# Patient Record
Sex: Male | Born: 1970
Health system: Southern US, Community
[De-identification: ages and names within clinical notes are randomized; demographics above are authoritative.]

## PROBLEM LIST (undated history)

## (undated) DIAGNOSIS — M545 Low back pain, unspecified: Secondary | ICD-10-CM

## (undated) DIAGNOSIS — E782 Mixed hyperlipidemia: Secondary | ICD-10-CM

## (undated) DIAGNOSIS — Z9889 Other specified postprocedural states: Secondary | ICD-10-CM

## (undated) DIAGNOSIS — R5383 Other fatigue: Secondary | ICD-10-CM

## (undated) DIAGNOSIS — M1990 Unspecified osteoarthritis, unspecified site: Secondary | ICD-10-CM

## (undated) DIAGNOSIS — J3089 Other allergic rhinitis: Secondary | ICD-10-CM

## (undated) DIAGNOSIS — R222 Localized swelling, mass and lump, trunk: Secondary | ICD-10-CM

## (undated) DIAGNOSIS — E7800 Pure hypercholesterolemia, unspecified: Secondary | ICD-10-CM

## (undated) DIAGNOSIS — R7989 Other specified abnormal findings of blood chemistry: Secondary | ICD-10-CM

## (undated) DIAGNOSIS — E669 Obesity, unspecified: Secondary | ICD-10-CM

## (undated) DIAGNOSIS — J320 Chronic maxillary sinusitis: Secondary | ICD-10-CM

## (undated) DIAGNOSIS — R739 Hyperglycemia, unspecified: Secondary | ICD-10-CM

## (undated) DIAGNOSIS — N529 Male erectile dysfunction, unspecified: Secondary | ICD-10-CM

## (undated) HISTORY — DX: Hyperglycemia, unspecified: R73.9

## (undated) HISTORY — DX: Other specified abnormal findings of blood chemistry: R79.89

## (undated) HISTORY — DX: Mixed hyperlipidemia: E78.2

## (undated) HISTORY — DX: Low back pain, unspecified: M54.50

## (undated) HISTORY — DX: Unspecified osteoarthritis, unspecified site: M19.90

## (undated) HISTORY — DX: Male erectile dysfunction, unspecified: N52.9

## (undated) HISTORY — DX: Localized swelling, mass and lump, trunk: R22.2

## (undated) HISTORY — PX: KNEE ARTHROSCOPY: SUR90

## (undated) HISTORY — DX: Other fatigue: R53.83

## (undated) HISTORY — DX: Low back pain: M54.5

## (undated) HISTORY — DX: Other allergic rhinitis: J30.89

## (undated) HISTORY — DX: Pure hypercholesterolemia, unspecified: E78.00

## (undated) HISTORY — DX: Obesity, unspecified: E66.9

## (undated) HISTORY — DX: Other specified postprocedural states: Z98.890

---

## 1998-05-08 ENCOUNTER — Ambulatory Visit (HOSPITAL_COMMUNITY): Admission: RE | Admit: 1998-05-08 | Discharge: 1998-05-08 | Payer: Self-pay | Admitting: Obstetrics and Gynecology

## 1998-07-12 ENCOUNTER — Ambulatory Visit (HOSPITAL_COMMUNITY): Admission: RE | Admit: 1998-07-12 | Discharge: 1998-07-12 | Payer: Self-pay | Admitting: Obstetrics and Gynecology

## 2006-07-27 ENCOUNTER — Encounter: Admission: RE | Admit: 2006-07-27 | Discharge: 2006-07-27 | Payer: Self-pay | Admitting: Sports Medicine

## 2015-05-23 ENCOUNTER — Other Ambulatory Visit: Payer: Self-pay | Admitting: Otolaryngology

## 2015-05-23 ENCOUNTER — Ambulatory Visit
Admission: RE | Admit: 2015-05-23 | Discharge: 2015-05-23 | Disposition: A | Discharge status: Home / Self Care | Payer: Commercial Managed Care - PPO | Source: Ambulatory Visit | Attending: Otolaryngology | Admitting: Otolaryngology

## 2015-05-23 DIAGNOSIS — J320 Chronic maxillary sinusitis: Secondary | ICD-10-CM

## 2015-06-28 ENCOUNTER — Encounter (HOSPITAL_BASED_OUTPATIENT_CLINIC_OR_DEPARTMENT_OTHER): Payer: Self-pay | Admitting: *Deleted

## 2015-06-29 ENCOUNTER — Ambulatory Visit: Payer: Self-pay | Admitting: Otolaryngology

## 2015-06-30 ENCOUNTER — Ambulatory Visit: Payer: Self-pay | Admitting: Otolaryngology

## 2015-06-30 NOTE — H&P (Signed)
  Assessment  Chronic maxillary sinusitis (473.0) (J32.0). Orders  Amoxicillin-Pot Clavulanate 875-125 MG Oral Tablet;TAKE 1 TABLET TWICE DAILY AFTER MEALS UNTIL FINISHED; Qty28; R0; Rx. Discussed  Findings and history are consistent with chronic maxillary sinusitis. We'll treat with Augmentin for 2 weeks. If he has anything less than 100% resolution of symptoms we will have him followup for sinus imaging. Reason For Visit  Sinus pressure. HPI  Since about March or April, he has had persistent left anterior maxillary pressure, left side nasal congestion, and occasional discharge from the nose. He has no prior history of allergies or sinus problems. Antihistamines and nasal steroid spray did not help. Allergies  No Known Drug Allergies. Current Meds  Fluticasone Propionate 50 MCG/ACT Nasal Suspension;; RPT Cetirizine HCl TABS;; RPT. [place]  Knee Surgery. Family Hx  Family history of breast cancer: Mother (V16.3) (Z80.3) Family history of diabetes mellitus: Son (V18.0) (Z83.3) Family history of heart disease: Father (V74.49) (Z1.49) Family history of hypertension: Father (V17.49) (Z82.49) Family history of migraine headaches: Mother (V17.2) (Z82.0) Family history of stroke: Grandfather (V17.1) (Z82.3) Seasonal allergies: Mother (J30.2). Personal Hx  Former smoker (V15.82) [phone]) No caffeine use Social alcohol use (Z78.9). ROS  12 system ROS was obtained and reviewed on the Health Maintenance form dated today.  Positive responses are shown above.  If the symptom is not checked, the patient has denied it. Vital Signs   Recorded by Iran Sizer on 04 May 2015 10:31 AM BP:123/72,  BSA Calculated: 2.33 ,  BMI Calculated: 31.66 ,  Weight: 240 lb , BMI: 31.7 kg/m2,  Height: 6 ft 1 in. Physical Exam  APPEARANCE: Well developed, well nourished, in no acute distress.  Normal affect, in a pleasant mood.  Oriented to time, place and person. COMMUNICATION: Normal voice   HEAD &  FACE:  No scars, lesions or masses of head and face.  Sinuses nontender to palpation, except for the anterior aspect of the left.  Salivary glands without mass or tenderness.  Facial strength symmetric.  No facial lesion, scars, or mass. EYES: EOMI with normal primary gaze alignment. Visual acuity grossly intact.  PERRLA EXTERNAL EAR & NOSE: No scars, lesions or masses  EAC & TYMPANIC MEMBRANE:  EAC shows no obstructing lesions or debris and tympanic membranes are normal bilaterally with good movement to insufflation. GROSS HEARING: Normal   TMJ:  Nontender  INTRANASAL EXAM: No polyps or purulence. There is a slight leftward septal deviation and edema of the left middle turbinates without polyps or exudate. NASOPHARYNX: Normal, without lesions. LIPS, TEETH & GUMS: No lip lesions, normal dentition and normal gums. ORAL CAVITY/OROPHARYNX:  Oral mucosa moist without lesion or asymmetry of the palate, tongue, tonsil or posterior pharynx. NECK:  Supple without adenopathy or mass. THYROID:  Normal with no masses palpable.  NEUROLOGIC:  No gross CN deficits. No nystagmus noted.   LYMPHATIC:  No enlarged nodes palpable. Signature  Electronically signed by : Michel Bickers.D

## 2015-07-03 ENCOUNTER — Encounter (HOSPITAL_BASED_OUTPATIENT_CLINIC_OR_DEPARTMENT_OTHER): Payer: Self-pay

## 2015-07-03 ENCOUNTER — Ambulatory Visit (HOSPITAL_BASED_OUTPATIENT_CLINIC_OR_DEPARTMENT_OTHER): Payer: Commercial Managed Care - PPO | Admitting: Anesthesiology

## 2015-07-03 ENCOUNTER — Ambulatory Visit (HOSPITAL_BASED_OUTPATIENT_CLINIC_OR_DEPARTMENT_OTHER)
Admission: RE | Admit: 2015-07-03 | Discharge: 2015-07-03 | Disposition: A | Discharge status: Home / Self Care | Payer: Commercial Managed Care - PPO | Source: Ambulatory Visit | Attending: Otolaryngology | Admitting: Otolaryngology

## 2015-07-03 ENCOUNTER — Encounter (HOSPITAL_BASED_OUTPATIENT_CLINIC_OR_DEPARTMENT_OTHER)
Admission: RE | Disposition: A | Discharge status: Home / Self Care | Payer: Self-pay | Source: Ambulatory Visit | Attending: Otolaryngology

## 2015-07-03 DIAGNOSIS — Z87891 Personal history of nicotine dependence: Secondary | ICD-10-CM | POA: Insufficient documentation

## 2015-07-03 DIAGNOSIS — J320 Chronic maxillary sinusitis: Secondary | ICD-10-CM | POA: Insufficient documentation

## 2015-07-03 HISTORY — PX: NASAL SINUS SURGERY: SHX719

## 2015-07-03 HISTORY — DX: Chronic maxillary sinusitis: J32.0

## 2015-07-03 SURGERY — SINUS SURGERY, ENDOSCOPIC
Anesthesia: General | Site: Nose | Laterality: Left

## 2015-07-03 MED ORDER — FENTANYL CITRATE (PF) 100 MCG/2ML IJ SOLN
25.0000 ug | INTRAMUSCULAR | Status: DC | PRN
  Administered 2015-07-03 (×3): 50 ug via INTRAVENOUS

## 2015-07-03 MED ORDER — GLYCOPYRROLATE 0.2 MG/ML IJ SOLN: 0.2000 mg | Freq: Once | INTRAMUSCULAR | Status: DC | PRN

## 2015-07-03 MED ORDER — HYDROMORPHONE HCL 1 MG/ML IJ SOLN
0.5000 mg | Freq: Once | INTRAMUSCULAR | Status: AC
  Administered 2015-07-03: 0.5 mg via INTRAVENOUS

## 2015-07-03 MED ORDER — MIDAZOLAM HCL 2 MG/2ML IJ SOLN
INTRAMUSCULAR | Status: AC
  Filled 2015-07-03: qty 2

## 2015-07-03 MED ORDER — FENTANYL CITRATE (PF) 100 MCG/2ML IJ SOLN
INTRAMUSCULAR | Status: AC
  Filled 2015-07-03: qty 2

## 2015-07-03 MED ORDER — PROPOFOL 10 MG/ML IV BOLUS
INTRAVENOUS | Status: AC
  Filled 2015-07-03: qty 20

## 2015-07-03 MED ORDER — LACTATED RINGERS IV SOLN
INTRAVENOUS | Status: DC
  Administered 2015-07-03: 07:00:00 via INTRAVENOUS

## 2015-07-03 MED ORDER — PROPOFOL 10 MG/ML IV BOLUS
INTRAVENOUS | Status: DC | PRN
  Administered 2015-07-03: 200 mg via INTRAVENOUS

## 2015-07-03 MED ORDER — HYDROMORPHONE HCL 1 MG/ML IJ SOLN
INTRAMUSCULAR | Status: AC
  Filled 2015-07-03: qty 1

## 2015-07-03 MED ORDER — LIDOCAINE-EPINEPHRINE 1 %-1:100000 IJ SOLN
INTRAMUSCULAR | Status: AC
  Filled 2015-07-03: qty 1

## 2015-07-03 MED ORDER — OXYMETAZOLINE HCL 0.05 % NA SOLN
2.0000 | NASAL | Status: DC
  Administered 2015-07-03: 2 via NASAL

## 2015-07-03 MED ORDER — DEXAMETHASONE SODIUM PHOSPHATE 4 MG/ML IJ SOLN
INTRAMUSCULAR | Status: DC | PRN
  Administered 2015-07-03: 10 mg via INTRAVENOUS

## 2015-07-03 MED ORDER — CEFAZOLIN SODIUM-DEXTROSE 2-3 GM-% IV SOLR
2.0000 g | INTRAVENOUS | Status: AC
  Administered 2015-07-03: 2 g via INTRAVENOUS

## 2015-07-03 MED ORDER — CEPHALEXIN 500 MG PO CAPS: 500.0000 mg | ORAL_CAPSULE | Freq: Three times a day (TID) | ORAL | Status: DC

## 2015-07-03 MED ORDER — ONDANSETRON HCL 4 MG/2ML IJ SOLN
INTRAMUSCULAR | Status: DC | PRN
  Administered 2015-07-03: 4 mg via INTRAVENOUS

## 2015-07-03 MED ORDER — DEXAMETHASONE SODIUM PHOSPHATE 10 MG/ML IJ SOLN
INTRAMUSCULAR | Status: AC
  Filled 2015-07-03: qty 1

## 2015-07-03 MED ORDER — LIDOCAINE HCL (CARDIAC) 20 MG/ML IV SOLN
INTRAVENOUS | Status: AC
  Filled 2015-07-03: qty 5

## 2015-07-03 MED ORDER — OXYCODONE HCL 5 MG PO TABS
5.0000 mg | ORAL_TABLET | Freq: Once | ORAL | Status: AC | PRN
  Administered 2015-07-03: 5 mg via ORAL

## 2015-07-03 MED ORDER — PROMETHAZINE HCL 25 MG/ML IJ SOLN: 6.2500 mg | INTRAMUSCULAR | Status: DC | PRN

## 2015-07-03 MED ORDER — HYDROCODONE-ACETAMINOPHEN 7.5-325 MG PO TABS: 1.0000 | ORAL_TABLET | Freq: Four times a day (QID) | ORAL | Status: DC | PRN

## 2015-07-03 MED ORDER — OXYMETAZOLINE HCL 0.05 % NA SOLN
NASAL | Status: AC
  Filled 2015-07-03: qty 15

## 2015-07-03 MED ORDER — ONDANSETRON HCL 4 MG/2ML IJ SOLN
INTRAMUSCULAR | Status: AC
  Filled 2015-07-03: qty 2

## 2015-07-03 MED ORDER — LIDOCAINE-EPINEPHRINE 1 %-1:100000 IJ SOLN
INTRAMUSCULAR | Status: DC | PRN
  Administered 2015-07-03: 4 mL

## 2015-07-03 MED ORDER — SUCCINYLCHOLINE CHLORIDE 20 MG/ML IJ SOLN
INTRAMUSCULAR | Status: DC | PRN
  Administered 2015-07-03: 100 mg via INTRAVENOUS

## 2015-07-03 MED ORDER — CEFAZOLIN SODIUM-DEXTROSE 2-3 GM-% IV SOLR
INTRAVENOUS | Status: AC
  Filled 2015-07-03: qty 50

## 2015-07-03 MED ORDER — BACIT-POLY-NEO HC 1 % EX OINT
TOPICAL_OINTMENT | CUTANEOUS | Status: AC
  Filled 2015-07-03: qty 15

## 2015-07-03 MED ORDER — PROMETHAZINE HCL 25 MG RE SUPP: 25.0000 mg | Freq: Four times a day (QID) | RECTAL | Status: DC | PRN

## 2015-07-03 MED ORDER — SUCCINYLCHOLINE CHLORIDE 20 MG/ML IJ SOLN
INTRAMUSCULAR | Status: AC
  Filled 2015-07-03: qty 1

## 2015-07-03 MED ORDER — SCOPOLAMINE 1 MG/3DAYS TD PT72: 1.0000 | MEDICATED_PATCH | Freq: Once | TRANSDERMAL | Status: DC | PRN

## 2015-07-03 MED ORDER — OXYCODONE HCL 5 MG PO TABS
ORAL_TABLET | ORAL | Status: AC
  Filled 2015-07-03: qty 1

## 2015-07-03 MED ORDER — MIDAZOLAM HCL 2 MG/2ML IJ SOLN
1.0000 mg | INTRAMUSCULAR | Status: DC | PRN
  Administered 2015-07-03: 2 mg via INTRAVENOUS

## 2015-07-03 MED ORDER — FENTANYL CITRATE (PF) 100 MCG/2ML IJ SOLN
50.0000 ug | INTRAMUSCULAR | Status: DC | PRN
  Administered 2015-07-03 (×2): 100 ug via INTRAVENOUS

## 2015-07-03 MED ORDER — OXYCODONE HCL 5 MG/5ML PO SOLN: 5.0000 mg | Freq: Once | ORAL | Status: AC | PRN

## 2015-07-03 MED ORDER — LIDOCAINE HCL (CARDIAC) 20 MG/ML IV SOLN
INTRAVENOUS | Status: DC | PRN
  Administered 2015-07-03: 100 mg via INTRAVENOUS

## 2015-07-03 SURGICAL SUPPLY — 47 items
ATTRACTOMAT 16X20 MAGNETIC DRP (DRAPES) ×3 IMPLANT
BLADE RAD40 ROTATE 4M 4 5PK (BLADE) IMPLANT
BLADE RAD40 ROTATE 4M 4MM 5PK (BLADE)
BLADE RAD60 ROTATE M4 4 5PK (BLADE) IMPLANT
BLADE RAD60 ROTATE M4 4MM 5PK (BLADE)
BLADE TRICUT ROTATE M4 4 5PK (BLADE) ×1 IMPLANT
BLADE TRICUT ROTATE M4 4MM 5PK (BLADE) ×1
BUR HS RAD FRONTAL 3 (BURR) IMPLANT
BUR HS RAD FRONTAL 3MM (BURR)
CANISTER SUC SOCK COL 7IN (MISCELLANEOUS) IMPLANT
CANISTER SUCT 1200ML W/VALVE (MISCELLANEOUS) ×6 IMPLANT
CORDS BIPOLAR (ELECTRODE) IMPLANT
DECANTER SPIKE VIAL GLASS SM (MISCELLANEOUS) IMPLANT
DRESSING ADAPTIC 1/2  N-ADH (PACKING) IMPLANT
DRESSING NASAL KENNEDY 3.5X.9 (MISCELLANEOUS) IMPLANT
DRSG NASAL KENNEDY 3.5X.9 (MISCELLANEOUS)
DRSG NASAL KENNEDY LMNT 8CM (GAUZE/BANDAGES/DRESSINGS) IMPLANT
DRSG NASOPORE 8CM (GAUZE/BANDAGES/DRESSINGS) IMPLANT
DRSG TELFA 3X8 NADH (GAUZE/BANDAGES/DRESSINGS) IMPLANT
GAUZE SPONGE 4X4 16PLY XRAY LF (GAUZE/BANDAGES/DRESSINGS) IMPLANT
GAUZE VASELINE FOILPK 1/2 X 72 (GAUZE/BANDAGES/DRESSINGS) IMPLANT
GLOVE BIO SURGEON STRL SZ 6.5 (GLOVE) ×1 IMPLANT
GLOVE BIO SURGEONS STRL SZ 6.5 (GLOVE) ×1
GLOVE ECLIPSE 7.5 STRL STRAW (GLOVE) ×3 IMPLANT
GOWN STRL REUS W/ TWL LRG LVL3 (GOWN DISPOSABLE) ×2 IMPLANT
GOWN STRL REUS W/TWL LRG LVL3 (GOWN DISPOSABLE) ×6
HEMOSTAT SURGICEL .5X2 ABSORB (HEMOSTASIS) IMPLANT
IV NS 500ML (IV SOLUTION) ×3
IV NS 500ML BAXH (IV SOLUTION) IMPLANT
NDL PRECISIONGLIDE 27X1.5 (NEEDLE) ×1 IMPLANT
NDL SPNL 25GX3.5 QUINCKE BL (NEEDLE) IMPLANT
NEEDLE PRECISIONGLIDE 27X1.5 (NEEDLE) ×3 IMPLANT
NEEDLE SPNL 25GX3.5 QUINCKE BL (NEEDLE) IMPLANT
NS IRRIG 1000ML POUR BTL (IV SOLUTION) IMPLANT
PACK BASIN DAY SURGERY FS (CUSTOM PROCEDURE TRAY) ×3 IMPLANT
PACK ENT DAY SURGERY (CUSTOM PROCEDURE TRAY) ×3 IMPLANT
PAD DRESSING TELFA 3X8 NADH (GAUZE/BANDAGES/DRESSINGS) IMPLANT
PATTIES SURGICAL .5 X3 (DISPOSABLE) ×3 IMPLANT
SLEEVE SCD COMPRESS KNEE MED (MISCELLANEOUS) ×2 IMPLANT
SOLUTION BUTLER CLEAR DIP (MISCELLANEOUS) ×3 IMPLANT
SPONGE GAUZE 2X2 8PLY STER LF (GAUZE/BANDAGES/DRESSINGS) ×1
SPONGE GAUZE 2X2 8PLY STRL LF (GAUZE/BANDAGES/DRESSINGS) ×2 IMPLANT
SPONGE SURGIFOAM ABS GEL 12-7 (HEMOSTASIS) IMPLANT
TOWEL OR 17X24 6PK STRL BLUE (TOWEL DISPOSABLE) ×6 IMPLANT
TUBE CONNECTING 20'X1/4 (TUBING) ×1
TUBE CONNECTING 20X1/4 (TUBING) ×1 IMPLANT
YANKAUER SUCT BULB TIP NO VENT (SUCTIONS) ×3 IMPLANT

## 2015-07-03 NOTE — H&P (View-Only) (Signed)
  Assessment  Chronic maxillary sinusitis (473.0) (J32.0). Orders  Amoxicillin-Pot Clavulanate 875-125 MG Oral Tablet;TAKE 1 TABLET TWICE DAILY AFTER MEALS UNTIL FINISHED; Qty28; R0; Rx. Discussed  Findings and history are consistent with chronic maxillary sinusitis. We'll treat with Augmentin for 2 weeks. If he has anything less than 100% resolution of symptoms we will have him followup for sinus imaging. Reason For Visit  Sinus pressure. HPI  Since about March or April, he has had persistent left anterior maxillary pressure, left side nasal congestion, and occasional discharge from the nose. He has no prior history of allergies or sinus problems. Antihistamines and nasal steroid spray did not help. Allergies  No Known Drug Allergies. Current Meds  Fluticasone Propionate 50 MCG/ACT Nasal Suspension;; RPT Cetirizine HCl TABS;; RPT. [place]  Knee Surgery. Family Hx  Family history of breast cancer: Mother (V16.3) (Z80.3) Family history of diabetes mellitus: Son (V18.0) (Z83.3) Family history of heart disease: Father (V28.49) (Z47.49) Family history of hypertension: Father (V17.49) (Z82.49) Family history of migraine headaches: Mother (V17.2) (Z82.0) Family history of stroke: Grandfather (V17.1) (Z82.3) Seasonal allergies: Mother (J30.2). Personal Hx  Former smoker (V15.82) [phone]) No caffeine use Social alcohol use (Z78.9). ROS  12 system ROS was obtained and reviewed on the Health Maintenance form dated today.  Positive responses are shown above.  If the symptom is not checked, the patient has denied it. Vital Signs   Recorded by Iran Sizer on 04 May 2015 10:31 AM BP:123/72,  BSA Calculated: 2.33 ,  BMI Calculated: 31.66 ,  Weight: 240 lb , BMI: 31.7 kg/m2,  Height: 6 ft 1 in. Physical Exam  APPEARANCE: Well developed, well nourished, in no acute distress.  Normal affect, in a pleasant mood.  Oriented to time, place and person. COMMUNICATION: Normal voice   HEAD &  FACE:  No scars, lesions or masses of head and face.  Sinuses nontender to palpation, except for the anterior aspect of the left.  Salivary glands without mass or tenderness.  Facial strength symmetric.  No facial lesion, scars, or mass. EYES: EOMI with normal primary gaze alignment. Visual acuity grossly intact.  PERRLA EXTERNAL EAR & NOSE: No scars, lesions or masses  EAC & TYMPANIC MEMBRANE:  EAC shows no obstructing lesions or debris and tympanic membranes are normal bilaterally with good movement to insufflation. GROSS HEARING: Normal   TMJ:  Nontender  INTRANASAL EXAM: No polyps or purulence. There is a slight leftward septal deviation and edema of the left middle turbinates without polyps or exudate. NASOPHARYNX: Normal, without lesions. LIPS, TEETH & GUMS: No lip lesions, normal dentition and normal gums. ORAL CAVITY/OROPHARYNX:  Oral mucosa moist without lesion or asymmetry of the palate, tongue, tonsil or posterior pharynx. NECK:  Supple without adenopathy or mass. THYROID:  Normal with no masses palpable.  NEUROLOGIC:  No gross CN deficits. No nystagmus noted.   LYMPHATIC:  No enlarged nodes palpable. Signature  Electronically signed by : Michel Bickers.D

## 2015-07-03 NOTE — Anesthesia Preprocedure Evaluation (Addendum)
Anesthesia Evaluation  Patient identified by MRN, date of birth, ID band Patient awake    Reviewed: Allergy & Precautions, NPO status , Patient's Chart, lab work & pertinent test results  History of Anesthesia Complications Negative for: history of anesthetic complications  Airway Mallampati: II  TM Distance: >3 FB Neck ROM: Full    Dental  (+) Teeth Intact, Dental Advisory Given   Pulmonary neg pulmonary ROS,    Pulmonary exam normal breath sounds clear to auscultation       Cardiovascular Exercise Tolerance: Good (-) hypertension(-) angina(-) CAD and (-) Past MI negative cardio ROS Normal cardiovascular exam Rhythm:Regular Rate:Normal     Neuro/Psych negative neurological ROS  negative psych ROS   GI/Hepatic negative GI ROS, Neg liver ROS,   Endo/Other  Obesity   Renal/GU negative Renal ROS     Musculoskeletal negative musculoskeletal ROS (+)   Abdominal   Peds  Hematology negative hematology ROS (+)   Anesthesia Other Findings Day of surgery medications reviewed with the patient.  Reproductive/Obstetrics                            Anesthesia Physical Anesthesia Plan  ASA: II  Anesthesia Plan: General   Post-op Pain Management:    Induction: Intravenous  Airway Management Planned: Oral ETT  Additional Equipment:   Intra-op Plan:   Post-operative Plan: Extubation in OR  Informed Consent: I have reviewed the patients History and Physical, chart, labs and discussed the procedure including the risks, benefits and alternatives for the proposed anesthesia with the patient or authorized representative who has indicated his/her understanding and acceptance.   Dental advisory given  Plan Discussed with: CRNA  Anesthesia Plan Comments: (Risks/benefits of general anesthesia discussed with patient including risk of damage to teeth, lips, gum, and tongue, nausea/vomiting, allergic  reactions to medications, and the possibility of heart attack, stroke and death.  All patient questions answered.  Patient wishes to proceed.)        Anesthesia Quick Evaluation

## 2015-07-03 NOTE — Interval H&P Note (Signed)
History and Physical Interval Note:  07/03/2015 7:19 AM  Jamie Holland  has presented today for surgery, with the diagnosis of MAXILLARY SINUSITIS  The various methods of treatment have been discussed with the patient and family. After consideration of risks, benefits and other options for treatment, the patient has consented to  Procedure(s): [place] (Left) as a surgical intervention .  The patient's history has been reviewed, patient examined, no change in status, stable for surgery.  I have reviewed the patient's chart and labs.  Questions were answered to the patient's satisfaction.     Jamie Holland

## 2015-07-03 NOTE — Anesthesia Procedure Notes (Signed)
Procedure Name: Intubation Date/Time: 07/03/2015 7:33 AM Performed by: Lyndee Leo Pre-anesthesia Checklist: Patient identified, Emergency Drugs available, Suction available and Patient being monitored Patient Re-evaluated:Patient Re-evaluated prior to inductionOxygen Delivery Method: Circle System Utilized Preoxygenation: Pre-oxygenation with 100% oxygen Intubation Type: IV induction Ventilation: Mask ventilation without difficulty Laryngoscope Size: Mac and 4 Grade View: Grade II Tube type: Oral Rae Tube size: 8.0 mm Number of attempts: 1 Airway Equipment and Method: Stylet and Oral airway Placement Confirmation: ETT inserted through vocal cords under direct vision,  positive ETCO2 and breath sounds checked- equal and bilateral Secured at: 22 cm Tube secured with: Tape Dental Injury: Teeth and Oropharynx as per pre-operative assessment

## 2015-07-03 NOTE — Op Note (Signed)
OPERATIVE REPORT  DATE OF SURGERY: 07/03/2015  PATIENT:  Jamie Holland,  45 y.o. male  PRE-OPERATIVE DIAGNOSIS:  MAXILLARY SINUSITIS  POST-OPERATIVE DIAGNOSIS:  MAXILLARY SINUSITIS  PROCEDURE:  Procedure(s): LEFT MAXILLLARY SINUS ENDOSCOPIC SINUS SURGERY  SURGEON:  Beckie Salts, MD  ASSISTANTS: None  ANESTHESIA:   General   EBL:  20 ml  DRAINS: None  LOCAL MEDICATIONS USED:  1% Xylocaine with epinephrine  SPECIMEN:  Left maxillary sinus contents  COUNTS:  Correct  PROCEDURE DETAILS: The patient was taken to the operating room and placed on the operating table in the supine position. Following induction of general endotracheal anesthesia, the nose was prepped and draped in a standard fashion. Afrin spray was used preoperatively in the nasal cavities. 1% Xylocaine with epinephrine was infiltrated into the superior and posterior attachments of the left middle turbinate and the lateral nasal wall on the left. The 0 endoscope was used initially to inspect the infundibular area. There is obvious fullness of the infundibular region caused by an expansile mass within the maxillary sinus. A sickle knife was used to incise mucosa to expose the maxillary ostium. The medial wall of the maxillary sinus bone was thin and was medially placed almost up against the middle turbinate. This was removed and a large antrostomy was created. The microdebrider and endoscopic forceps were used to perform this. Inside the sinus was filled with a large mucocele appearing mass. This was opened and a large amount of thick pus was suctioned out. There was severe thickening of the soft tissue around the inferior medial aspect of the cyst. This was all cleaned out using curettes and angled forceps. The mucosa was stripped completely from the sinus. The microdebrider was used to trim off excess mucosa from the inferior aspect of the antrostomy. A backbiting forcep was used to trim the anterior aspect. All specimens  were sent for pathologic evaluation. Afrin pledgets were placed in the middle meatus. The pharynx was suctioned of blood and secretions. Patient was awakened extubated and transferred to recovery in stable condition. The pledgets were removed after extubation.    PATIENT DISPOSITION:  To PACU, stable

## 2015-07-03 NOTE — Discharge Instructions (Signed)
Use nasal saline spray every 30 minutes while awake.  Call your surgeon if you experience:   1.  Fever over 101.0. 2.  Inability to urinate. 3.  Nausea and/or vomiting. 4.  Extreme swelling or bruising at the surgical site. 5.  Continued bleeding from the incision. 6.  Increased pain, redness or drainage from the incision. 7.  Problems related to your pain medication. 8. Any problems and/or concerns.  Postoperative Anesthesia Instructions-Pediatric  Activity: Your child should rest for the remainder of the day. A responsible adult should stay with your child for 24 hours.  Meals: Your child should start with liquids and light foods such as gelatin or soup unless otherwise instructed by the physician. Progress to regular foods as tolerated. Avoid spicy, greasy, and heavy foods. If nausea and/or vomiting occur, drink only clear liquids such as apple juice or Pedialyte until the nausea and/or vomiting subsides. Call your physician if vomiting continues.  Special Instructions/Symptoms: Your child may be drowsy for the rest of the day, although some children experience some hyperactivity a few hours after the surgery. Your child may also experience some irritability or crying episodes due to the operative procedure and/or anesthesia. Your child's throat may feel dry or sore from the anesthesia or the breathing tube placed in the throat during surgery. Use throat lozenges, sprays, or ice chips if needed.

## 2015-07-03 NOTE — Transfer of Care (Signed)
Immediate Anesthesia Transfer of Care Note  Patient: NALU LABA  Procedure(s) Performed: Procedure(s): LEFT MAXILLLARY SINUS ENDOSCOPIC SINUS SURGERY (Left)  Patient Location: PACU  Anesthesia Type:General  Level of Consciousness: awake, sedated and patient cooperative  Airway & Oxygen Therapy: Patient Spontanous Breathing and aerosol face mask  Post-op Assessment: Report given to RN and Post -op Vital signs reviewed and stable  Post vital signs: Reviewed and stable  Last Vitals:  Filed Vitals:   07/03/15 0650 07/03/15 0825  BP: 124/68   Pulse: 66 85  Temp: 36.7 C   Resp: 16     Complications: No apparent anesthesia complications

## 2015-07-04 ENCOUNTER — Encounter (HOSPITAL_BASED_OUTPATIENT_CLINIC_OR_DEPARTMENT_OTHER): Payer: Self-pay | Admitting: Otolaryngology

## 2015-07-05 NOTE — Anesthesia Postprocedure Evaluation (Signed)
Anesthesia Post Note  Patient: Jamie Holland  Procedure(s) Performed: Procedure(s) (LRB): LEFT MAXILLLARY SINUS ENDOSCOPIC SINUS SURGERY (Left)  Patient location during evaluation: PACU Anesthesia Type: General Level of consciousness: awake and alert Pain management: pain level controlled Vital Signs Assessment: post-procedure vital signs reviewed and stable Respiratory status: spontaneous breathing, nonlabored ventilation, respiratory function stable and patient connected to nasal cannula oxygen Cardiovascular status: blood pressure returned to baseline and stable Postop Assessment: no signs of nausea or vomiting Anesthetic complications: no    Last Vitals:  Filed Vitals:   07/03/15 1015 07/03/15 1031  BP:  123/82  Pulse: 69 71  Temp:  36.6 C  Resp:  16    Last Pain:  Filed Vitals:   07/04/15 1048  PainSc: [place] [name]

## 2015-09-05 DIAGNOSIS — H903 Sensorineural hearing loss, bilateral: Secondary | ICD-10-CM | POA: Insufficient documentation

## 2015-09-05 DIAGNOSIS — M26609 Unspecified temporomandibular joint disorder, unspecified side: Secondary | ICD-10-CM | POA: Insufficient documentation

## 2015-09-05 DIAGNOSIS — H9313 Tinnitus, bilateral: Secondary | ICD-10-CM | POA: Insufficient documentation

## 2015-09-05 DIAGNOSIS — R0981 Nasal congestion: Secondary | ICD-10-CM | POA: Insufficient documentation

## 2017-05-02 DIAGNOSIS — R739 Hyperglycemia, unspecified: Secondary | ICD-10-CM | POA: Diagnosis not present

## 2017-05-02 DIAGNOSIS — E291 Testicular hypofunction: Secondary | ICD-10-CM | POA: Diagnosis not present

## 2017-05-23 DIAGNOSIS — H524 Presbyopia: Secondary | ICD-10-CM | POA: Diagnosis not present

## 2017-05-23 DIAGNOSIS — H43813 Vitreous degeneration, bilateral: Secondary | ICD-10-CM | POA: Diagnosis not present

## 2017-11-25 ENCOUNTER — Other Ambulatory Visit: Payer: Self-pay

## 2017-11-25 ENCOUNTER — Encounter (HOSPITAL_COMMUNITY): Payer: Self-pay | Admitting: Emergency Medicine

## 2017-11-25 ENCOUNTER — Emergency Department (HOSPITAL_COMMUNITY)
Admission: EM | Admit: 2017-11-25 | Discharge: 2017-11-25 | Disposition: A | Discharge status: Home / Self Care | Payer: Commercial Managed Care - PPO | Attending: Emergency Medicine | Admitting: Emergency Medicine

## 2017-11-25 ENCOUNTER — Emergency Department (HOSPITAL_COMMUNITY): Payer: Commercial Managed Care - PPO

## 2017-11-25 DIAGNOSIS — N201 Calculus of ureter: Secondary | ICD-10-CM | POA: Diagnosis not present

## 2017-11-25 DIAGNOSIS — Z79899 Other long term (current) drug therapy: Secondary | ICD-10-CM | POA: Diagnosis not present

## 2017-11-25 DIAGNOSIS — R109 Unspecified abdominal pain: Secondary | ICD-10-CM | POA: Diagnosis not present

## 2017-11-25 DIAGNOSIS — R1012 Left upper quadrant pain: Secondary | ICD-10-CM | POA: Diagnosis present

## 2017-11-25 LAB — CBC
HCT: 44.4 % (ref 39.0–52.0)
Hemoglobin: 15.1 g/dL (ref 13.0–17.0)
MCH: 30.8 pg (ref 26.0–34.0)
MCHC: 34 g/dL (ref 30.0–36.0)
MCV: 90.4 fL (ref 78.0–100.0)
Platelets: 181 10*3/uL (ref 150–400)
RBC: 4.91 MIL/uL (ref 4.22–5.81)
RDW: 12.5 % (ref 11.5–15.5)
WBC: 6.5 10*3/uL (ref 4.0–10.5)

## 2017-11-25 LAB — BASIC METABOLIC PANEL
Anion gap: 10 (ref 5–15)
BUN: 16 mg/dL (ref 6–20)
CO2: 22 mmol/L (ref 22–32)
Calcium: 9.2 mg/dL (ref 8.9–10.3)
Chloride: 108 mmol/L (ref 101–111)
Creatinine, Ser: 1.24 mg/dL (ref 0.61–1.24)
GFR calc Af Amer: >60 mL/min (ref >60)
GFR calc non Af Amer: >60 mL/min (ref >60)
Glucose, Bld: 162 mg/dL — ABNORMAL HIGH (ref 65–99)
Potassium: 4.5 mmol/L (ref 3.5–5.1)
Sodium: 140 mmol/L (ref 135–145)

## 2017-11-25 MED ORDER — OXYCODONE-ACETAMINOPHEN 5-325 MG PO TABS: 1.0000 | ORAL_TABLET | ORAL | 0 refills | Status: DC | PRN

## 2017-11-25 MED ORDER — OXYCODONE-ACETAMINOPHEN 5-325 MG PO TABS
2.0000 | ORAL_TABLET | Freq: Once | ORAL | Status: AC
Start: 2017-11-25 — End: 2017-11-25
  Administered 2017-11-25: 2 via ORAL
  Filled 2017-11-25: qty 2

## 2017-11-25 MED ORDER — HYDROMORPHONE HCL 1 MG/ML IJ SOLN
1.0000 mg | Freq: Once | INTRAMUSCULAR | Status: AC
  Administered 2017-11-25: 1 mg via INTRAVENOUS
  Filled 2017-11-25: qty 1

## 2017-11-25 MED ORDER — TAMSULOSIN HCL 0.4 MG PO CAPS: 0.4000 mg | ORAL_CAPSULE | Freq: Every day | ORAL | 0 refills | Status: DC

## 2017-11-25 MED ORDER — MORPHINE SULFATE (PF) 4 MG/ML IV SOLN
4.0000 mg | Freq: Once | INTRAVENOUS | Status: AC
Start: 2017-11-25 — End: 2017-11-25
  Administered 2017-11-25: 4 mg via INTRAVENOUS
  Filled 2017-11-25: qty 1

## 2017-11-25 MED ORDER — ONDANSETRON HCL 4 MG/2ML IJ SOLN
4.0000 mg | Freq: Once | INTRAMUSCULAR | Status: AC
  Administered 2017-11-25: 4 mg via INTRAVENOUS
  Filled 2017-11-25: qty 2

## 2017-11-25 MED ORDER — KETOROLAC TROMETHAMINE 30 MG/ML IJ SOLN
30.0000 mg | Freq: Once | INTRAMUSCULAR | Status: AC
  Administered 2017-11-25: 30 mg via INTRAVENOUS
  Filled 2017-11-25: qty 1

## 2017-11-25 MED ORDER — ONDANSETRON 8 MG PO TBDP: 8.0000 mg | ORAL_TABLET | Freq: Three times a day (TID) | ORAL | 0 refills | Status: DC | PRN

## 2017-11-25 NOTE — ED Provider Notes (Signed)
[facility] DEPT Provider Note   CSN: [id number] Arrival date & time: 11/25/17  9622     History   Chief Complaint Chief Complaint  Patient presents with  . Flank Pain    HPI Jamie Holland is a 47 y.o. male.  HPI 47 year old male with no history of kidney stones who presents to the emergency department with acute onset left-sided flank pain with radiation towards the left groin started approximately 3 AM this morning.  His pain waxes and wanes.  His pain is severe in severity.  He has associated nausea and vomiting.  No fevers or chills.  No dysuria.  Patient is never experienced pain like this before and is never experienced pain as severe as this before.   Past Medical History:  Diagnosis Date  . Maxillary sinusitis     There are no active problems to display for this patient.   Past Surgical History:  Procedure Laterality Date  . KNEE ARTHROSCOPY Left    x2  . NASAL SINUS SURGERY Left 07/03/2015   Procedure: LEFT MAXILLLARY SINUS ENDOSCOPIC SINUS SURGERY;  Surgeon: Izora Gala, MD;  Location: [facility];  Service: ENT;  Laterality: Left;        Home Medications    Prior to Admission medications   Medication Sig Start Date End Date Taking? Authorizing Provider  OVER THE COUNTER MEDICATION Take 1 capsule by mouth daily.   Yes [provider]  vitamin B-12 (CYANOCOBALAMIN) 500 MCG tablet Take 500 mcg by mouth daily.   Yes [provider]  ondansetron (ZOFRAN ODT) 8 MG disintegrating tablet Take 1 tablet (8 mg total) by mouth every 8 (eight) hours as needed for nausea or vomiting. 11/25/17   Jola Schmidt, MD  oxyCODONE-acetaminophen (PERCOCET/ROXICET) 5-325 MG tablet Take 1 tablet by mouth every 4 (four) hours as needed for severe pain. 11/25/17   Jola Schmidt, MD  tamsulosin (FLOMAX) 0.4 MG CAPS capsule Take 1 capsule (0.4 mg total) by mouth daily. 11/25/17   Jola Schmidt, MD    Family History History  reviewed. No pertinent family history.  Social History Social History   Tobacco Use  . Smoking status: Never Smoker  . Smokeless tobacco: Never Used  Substance Use Topics  . Alcohol use: Yes    Comment: social  . Drug use: No     Allergies   Patient has no known allergies.   Review of Systems Review of Systems  All other systems reviewed and are negative.    Physical Exam Updated Vital Signs BP 128/74 (BP Location: Right Arm)   Pulse 73   Temp (!) 97.4 F (36.3 C) (Oral)   Resp 17   Ht 6' 1.5" (1.867 m)   Wt 113.4 kg (250 lb)   SpO2 100%   BMI 32.54 kg/m   Physical Exam  Constitutional: He is oriented to person, place, and time. He appears well-developed and well-nourished.  HENT:  Head: Normocephalic.  Eyes: EOM are normal.  Neck: Normal range of motion.  Cardiovascular: Normal rate.  Pulmonary/Chest: Effort normal.  Abdominal: Soft. He exhibits no distension. There is no tenderness.  Musculoskeletal: Normal range of motion.  Neurological: He is alert and oriented to person, place, and time.  Psychiatric: He has a normal mood and affect.  Nursing note and vitals reviewed.    ED Treatments / Results  Labs (all labs ordered are listed, but only abnormal results are displayed) Labs Reviewed  BASIC METABOLIC PANEL - Abnormal; Notable  for the following components:      Result Value   Glucose, Bld 162 (*)    All other components within normal limits  CBC  URINALYSIS, ROUTINE W REFLEX MICROSCOPIC    EKG None  Radiology Ct Renal Stone Study  Result Date: 11/25/2017 CLINICAL DATA:  LEFT flank pain since 0300 hours, intermittent severity radiating to LEFT testicle, no prior history of kidney stones EXAM: CT ABDOMEN AND PELVIS WITHOUT CONTRAST TECHNIQUE: Multidetector CT imaging of the abdomen and pelvis was performed following the standard protocol without IV contrast. Sagittal and coronal MPR images reconstructed from axial data set. Oral contrast not  administered for this indication. COMPARISON:  None FINDINGS: Lower chest: Lung bases clear Hepatobiliary: Fatty infiltration of liver. Gallbladder and liver otherwise unremarkable Pancreas: Normal appearance Spleen: Normal appearance Adrenals/Urinary Tract: Adrenal glands normal appearance. RIGHT kidney and ureter normal appearance. LEFT hydronephrosis and hydroureter secondary to a 4 mm calculus at the LEFT ureterovesical junction. LEFT kidney otherwise unremarkable. Remainder of bladder normal appearance. Stomach/Bowel: Normal appendix. Stomach and bowel loops normal appearance for technique Vascular/Lymphatic: Aorta normal caliber.  No adenopathy. Reproductive: Mild prostatic enlargement, gland 5.1 x 3.9 cm image 91. Seminal vesicles unremarkable. Other: No free air or free fluid. No hernia or inflammatory process. Musculoskeletal: Degenerative disc disease changes greatest at L4-L5. IMPRESSION: LEFT hydronephrosis and hydroureter secondary to a 4 mm calculus at the LEFT ureterovesical junction. Fatty infiltration of liver. Mild prostatic enlargement. Electronically Signed   By: Lavonia Dana M.D.   On: 11/25/2017 08:20    Procedures Procedures (including critical care time)  Medications Ordered in ED Medications  HYDROmorphone (DILAUDID) injection 1 mg (1 mg Intravenous Given 11/25/17 0741)  ketorolac (TORADOL) 30 MG/ML injection 30 mg (30 mg Intravenous Given 11/25/17 0741)  ondansetron (ZOFRAN) injection 4 mg (4 mg Intravenous Given 11/25/17 0741)  oxyCODONE-acetaminophen (PERCOCET/ROXICET) 5-325 MG per tablet 2 tablet (2 tablets Oral Given 11/25/17 0921)  morphine 4 MG/ML injection 4 mg (4 mg Intravenous Given 11/25/17 0921)     Initial Impression / Assessment and Plan / ED Course  I have reviewed the triage vital signs and the nursing notes.  Pertinent labs & imaging results that were available during my care of the patient were reviewed by me and considered in my medical decision making (see chart  for details).     9:57 AM Symptoms controlled here in the emergency department.  Patient still unable to give urine sample.  Pain improved.  Patient with left-sided ureteral stone and associated ureteral colic.  Outpatient urology follow-up.  Standard stone precautions.  Home with standard medications.  Patient understands to return the emergency department for new or worsening symptoms  Final Clinical Impressions(s) / ED Diagnoses   Final diagnoses:  Left ureteral stone    ED Discharge Orders        Ordered    ondansetron (ZOFRAN ODT) 8 MG disintegrating tablet  Every 8 hours PRN     11/25/17 0956    oxyCODONE-acetaminophen (PERCOCET/ROXICET) 5-325 MG tablet  Every 4 hours PRN     11/25/17 0956    tamsulosin (FLOMAX) 0.4 MG CAPS capsule  Daily     11/25/17 0956       Jola Schmidt, MD 11/25/17 [phone]

## 2017-11-25 NOTE — ED Triage Notes (Signed)
Pt reports left flank pain that started at 0300 and has intermittent severity.

## 2017-11-28 DIAGNOSIS — R350 Frequency of micturition: Secondary | ICD-10-CM | POA: Diagnosis not present

## 2017-11-28 DIAGNOSIS — N201 Calculus of ureter: Secondary | ICD-10-CM | POA: Diagnosis not present

## 2018-02-10 DIAGNOSIS — R739 Hyperglycemia, unspecified: Secondary | ICD-10-CM | POA: Diagnosis not present

## 2018-02-10 DIAGNOSIS — E782 Mixed hyperlipidemia: Secondary | ICD-10-CM | POA: Diagnosis not present

## 2018-02-10 DIAGNOSIS — R5383 Other fatigue: Secondary | ICD-10-CM | POA: Diagnosis not present

## 2018-02-10 DIAGNOSIS — E669 Obesity, unspecified: Secondary | ICD-10-CM | POA: Diagnosis not present

## 2018-02-10 DIAGNOSIS — E291 Testicular hypofunction: Secondary | ICD-10-CM | POA: Diagnosis not present

## 2018-04-06 ENCOUNTER — Emergency Department (HOSPITAL_COMMUNITY): Payer: Commercial Managed Care - PPO

## 2018-04-06 ENCOUNTER — Other Ambulatory Visit: Payer: Self-pay

## 2018-04-06 ENCOUNTER — Encounter (HOSPITAL_COMMUNITY): Payer: Self-pay | Admitting: Emergency Medicine

## 2018-04-06 ENCOUNTER — Emergency Department (HOSPITAL_COMMUNITY)
Admission: EM | Admit: 2018-04-06 | Discharge: 2018-04-06 | Disposition: A | Discharge status: Home / Self Care | Payer: Commercial Managed Care - PPO | Attending: Emergency Medicine | Admitting: Emergency Medicine

## 2018-04-06 DIAGNOSIS — S60132A Contusion of left middle finger with damage to nail, initial encounter: Secondary | ICD-10-CM | POA: Diagnosis not present

## 2018-04-06 DIAGNOSIS — Y929 Unspecified place or not applicable: Secondary | ICD-10-CM | POA: Insufficient documentation

## 2018-04-06 DIAGNOSIS — S61313A Laceration without foreign body of left middle finger with damage to nail, initial encounter: Secondary | ICD-10-CM | POA: Diagnosis not present

## 2018-04-06 DIAGNOSIS — W231XXA Caught, crushed, jammed, or pinched between stationary objects, initial encounter: Secondary | ICD-10-CM | POA: Diagnosis not present

## 2018-04-06 DIAGNOSIS — Y998 Other external cause status: Secondary | ICD-10-CM | POA: Diagnosis not present

## 2018-04-06 DIAGNOSIS — S62633B Displaced fracture of distal phalanx of left middle finger, initial encounter for open fracture: Secondary | ICD-10-CM | POA: Diagnosis not present

## 2018-04-06 DIAGNOSIS — S6010XA Contusion of unspecified finger with damage to nail, initial encounter: Secondary | ICD-10-CM

## 2018-04-06 DIAGNOSIS — S62663B Nondisplaced fracture of distal phalanx of left middle finger, initial encounter for open fracture: Secondary | ICD-10-CM | POA: Diagnosis not present

## 2018-04-06 DIAGNOSIS — Z23 Encounter for immunization: Secondary | ICD-10-CM | POA: Diagnosis not present

## 2018-04-06 DIAGNOSIS — S67193A Crushing injury of left middle finger, initial encounter: Secondary | ICD-10-CM | POA: Diagnosis present

## 2018-04-06 DIAGNOSIS — S62639B Displaced fracture of distal phalanx of unspecified finger, initial encounter for open fracture: Secondary | ICD-10-CM

## 2018-04-06 DIAGNOSIS — S62633A Displaced fracture of distal phalanx of left middle finger, initial encounter for closed fracture: Secondary | ICD-10-CM | POA: Diagnosis not present

## 2018-04-06 DIAGNOSIS — Z79899 Other long term (current) drug therapy: Secondary | ICD-10-CM | POA: Insufficient documentation

## 2018-04-06 DIAGNOSIS — Y9389 Activity, other specified: Secondary | ICD-10-CM | POA: Diagnosis not present

## 2018-04-06 MED ORDER — CEPHALEXIN 500 MG PO CAPS: 500.0000 mg | ORAL_CAPSULE | Freq: Four times a day (QID) | ORAL | 0 refills | Status: AC

## 2018-04-06 MED ORDER — STERILE WATER FOR INJECTION IJ SOLN
INTRAMUSCULAR | Status: AC
  Administered 2018-04-06: 10 mL
  Filled 2018-04-06: qty 10

## 2018-04-06 MED ORDER — TRAMADOL HCL 50 MG PO TABS: 50.0000 mg | ORAL_TABLET | Freq: Two times a day (BID) | ORAL | 0 refills | Status: DC | PRN

## 2018-04-06 MED ORDER — TETANUS-DIPHTH-ACELL PERTUSSIS 5-2.5-18.5 LF-MCG/0.5 IM SUSP
0.5000 mL | Freq: Once | INTRAMUSCULAR | Status: AC
  Administered 2018-04-06: 0.5 mL via INTRAMUSCULAR
  Filled 2018-04-06: qty 0.5

## 2018-04-06 MED ORDER — LIDOCAINE HCL 2 % IJ SOLN
10.0000 mL | Freq: Once | INTRAMUSCULAR | Status: AC
  Administered 2018-04-06: 200 mg
  Filled 2018-04-06: qty 20

## 2018-04-06 MED ORDER — CEFAZOLIN SODIUM 1 G IJ SOLR
1.0000 g | Freq: Once | INTRAMUSCULAR | Status: AC
  Administered 2018-04-06: 1 g via INTRAMUSCULAR
  Filled 2018-04-06: qty 10

## 2018-04-06 NOTE — ED Provider Notes (Signed)
[facility] EMERGENCY DEPARTMENT Provider Note   CSN: [id number] Arrival date & time: 04/06/18  1444     History   Chief Complaint Chief Complaint  Patient presents with  . Finger Injury    HPI Jamie Holland is a 47 y.o. male presents for evaluation of acute onset, constant left third digit pain secondary to injury at around 1:45 PM.  He states that he was splitting wood and his finger was crushed in between 2 pieces of wood.  He notes a constant throbbing pain with occasional sharp shooting pain radiating down the digit.  He does note tingling of the distal aspect of the fingertip.  Pain worsens with movement.  He is not on blood thinners.  He is unsure if his tetanus is up-to-date.  The history is provided by the patient.    Past Medical History:  Diagnosis Date  . Maxillary sinusitis     There are no active problems to display for this patient.   Past Surgical History:  Procedure Laterality Date  . KNEE ARTHROSCOPY Left    x2  . NASAL SINUS SURGERY Left 07/03/2015   Procedure: LEFT MAXILLLARY SINUS ENDOSCOPIC SINUS SURGERY;  Surgeon: Izora Gala, MD;  Location: [facility];  Service: ENT;  Laterality: Left;        Home Medications    Prior to Admission medications   Medication Sig Start Date End Date Taking? Authorizing Provider  cephALEXin (KEFLEX) 500 MG capsule Take 1 capsule (500 mg total) by mouth 4 (four) times daily for 5 days. 04/06/18 04/11/18  Rodell Perna A, PA-C  ondansetron (ZOFRAN ODT) 8 MG disintegrating tablet Take 1 tablet (8 mg total) by mouth every 8 (eight) hours as needed for nausea or vomiting. 11/25/17   Jola Schmidt, MD  OVER THE COUNTER MEDICATION Take 1 capsule by mouth daily.    [provider]  oxyCODONE-acetaminophen (PERCOCET/ROXICET) 5-325 MG tablet Take 1 tablet by mouth every 4 (four) hours as needed for severe pain. 11/25/17   Jola Schmidt, MD  tamsulosin (FLOMAX) 0.4 MG CAPS capsule Take 1  capsule (0.4 mg total) by mouth daily. 11/25/17   Jola Schmidt, MD  traMADol (ULTRAM) 50 MG tablet Take 1 tablet (50 mg total) by mouth every 12 (twelve) hours as needed for severe pain. 04/06/18   Mylah Baynes A, PA-C  vitamin B-12 (CYANOCOBALAMIN) 500 MCG tablet Take 500 mcg by mouth daily.    [provider]    Family History No family history on file.  Social History Social History   Tobacco Use  . Smoking status: Never Smoker  . Smokeless tobacco: Never Used  Substance Use Topics  . Alcohol use: Yes    Comment: social  . Drug use: No     Allergies   Patient has no known allergies.   Review of Systems Review of Systems  Constitutional: Negative for chills and fever.  Musculoskeletal: Positive for arthralgias.  Skin: Positive for wound.  Neurological: Positive for numbness. Negative for weakness.     Physical Exam Updated Vital Signs BP 125/71 (BP Location: Right Arm)   Pulse 84   Temp 98.8 F (37.1 C)   Resp 16   SpO2 98%   Physical Exam  Constitutional: He appears well-developed and well-nourished. No distress.  HENT:  Head: Normocephalic and atraumatic.  Eyes: Conjunctivae are normal. Right eye exhibits no discharge. Left eye exhibits no discharge.  Neck: No JVD present. No tracheal deviation present.  Cardiovascular: Normal  rate and intact distal pulses.  2+ radial pulses bilaterally  Pulmonary/Chest: Effort normal.  Abdominal: He exhibits no distension.  Musculoskeletal: He exhibits no edema.  Multiple small lacerations totaling around 3 cm in length to the palmar aspect of the distal left third digit.  Bleeding controlled.  Lacerations do not extend into the nail or nailbed and the nail appears firmly adhered to the nailbed.  There is a small subungual hematoma.  Normal active range of motion of the digit and 5/5 strength of wrist and digits bilaterally with flexion and extension against resistance.  Good grip strength bilaterally.  Neurological:  He is alert.  Fluent speech, no facial droop, slightly altered sensation to soft touch of the distal fingertip of the left third digit, otherwise sensation intact to soft touch of the hands.  Skin: Skin is warm and dry. No erythema.  Psychiatric: He has a normal mood and affect. His behavior is normal.  Nursing note and vitals reviewed.    ED Treatments / Results  Labs (all labs ordered are listed, but only abnormal results are displayed) Labs Reviewed - No data to display  EKG None  Radiology Dg Finger Middle Left  Result Date: 04/06/2018 CLINICAL DATA:  47 year old male. Two pieces of wood fell on left finger today. Initial encounter. EXAM: LEFT MIDDLE FINGER 2+V COMPARISON:  None. FINDINGS: Comminuted fracture left third finger distal phalanx tuft. Adjacent soft tissue swelling/laceration. No radiopaque foreign body noted. Degenerative changes left first metacarpal carpal articulation. IMPRESSION: Comminuted fracture left third finger distal phalanx tuft. Adjacent soft tissue swelling/laceration. No radiopaque foreign body noted. Electronically Signed   By: Genia Del M.D.   On: 04/06/2018 16:03    Procedures .Nail Removal Date/Time: 04/06/2018 6:39 PM Performed by: Renita Papa, PA-C Authorized by: Renita Papa, PA-C   Consent:    Consent obtained:  Verbal   Consent given by:  Patient   Risks discussed:  Bleeding, infection and pain Anesthesia (see MAR for exact dosages):    Anesthesia method:  Nerve block   Block location:  Left third digit base   Block needle gauge:  27 G   Block anesthetic:  Lidocaine 2% w/o epi   Block technique:  Tendon sheath, digital nerve block   Block injection procedure:  Anatomic landmarks identified, anatomic landmarks palpated, introduced needle and negative aspiration for blood   Block outcome:  Anesthesia achieved Trephination:    Subungual hematoma drained: yes     Trephination instrument:  Cautery Post-procedure details:     Dressing:  Non-adhesive packing strip   Patient tolerance of procedure:  Tolerated well, no immediate complications .Marland KitchenLaceration Repair Date/Time: 04/06/2018 6:40 PM Performed by: Renita Papa, PA-C Authorized by: Renita Papa, PA-C   Consent:    Consent obtained:  Verbal   Consent given by:  Patient   Risks discussed:  Infection, pain, poor cosmetic result and poor wound healing Anesthesia (see MAR for exact dosages):    Anesthesia method:  Nerve block   Block location:  Left third digit base   Block needle gauge:  27 G   Block anesthetic:  Lidocaine 2% w/o epi   Block technique:  Digital nerve block   Block injection procedure:  Anatomic landmarks identified, anatomic landmarks palpated, negative aspiration for blood, introduced needle and incremental injection   Block outcome:  Anesthesia achieved Laceration details:    Location:  Finger   Finger location:  L long finger   Length (cm):  3  Depth (mm):  2 Repair type:    Repair type:  Simple Pre-procedure details:    Preparation:  Patient was prepped and draped in usual sterile fashion and imaging obtained to evaluate for foreign bodies Exploration:    Hemostasis achieved with:  Direct pressure   Wound exploration: wound explored through full range of motion and entire depth of wound probed and visualized     Wound extent: areolar tissue violated and underlying fracture     Contaminated: yes   Treatment:    Area cleansed with:  Betadine and saline   Amount of cleaning:  Extensive   Irrigation solution:  Sterile saline   Irrigation method:  Pressure wash   Visualized foreign bodies/material removed: no   Skin repair:    Repair method:  Sutures   Suture size:  4-0   Wound skin closure material used: vicryl rapide.   Suture technique:  Simple interrupted and horizontal mattress   Number of sutures:  6 (1 horizontal mattress, 5 simple interrupted) Approximation:    Approximation:  Close Post-procedure details:     Dressing:  Non-adherent dressing   Patient tolerance of procedure:  Tolerated well, no immediate complications   (including critical care time)  Medications Ordered in ED Medications  lidocaine (XYLOCAINE) 2 % (with pres) injection 200 mg (200 mg Infiltration Given 04/06/18 1724)  ceFAZolin (ANCEF) injection 1 g (1 g Intramuscular Given 04/06/18 1900)  Tdap (BOOSTRIX) injection 0.5 mL (0.5 mLs Intramuscular Given 04/06/18 1901)  sterile water (preservative free) injection (10 mLs  Given 04/06/18 1901)     Initial Impression / Assessment and Plan / ED Course  I have reviewed the triage vital signs and the nursing notes.  Pertinent labs & imaging results that were available during my care of the patient were reviewed by me and considered in my medical decision making (see chart for details).     Patient with multiple small lacerations (<79mm each) to the palmar aspect of the left third digit.  He is afebrile, vital signs are stable.  He is nontoxic in appearance.  Compartments soft.  Some numbness and tingling to the distal aspect of the digit.  Otherwise neurovascularly intact.  Radiographs show underlying comminuted distal phalanx tuft fracture.  No foreign bodies.  Digital block was performed, subungual hematoma was drained using cautery. Pressure irrigation performed. Wound explored and base of wound visualized in a bloodless field without evidence of foreign body.  Laceration occurred < 8 hours prior to repair which was well tolerated.  Tdap updated.  Pt has  no comorbidities to effect normal wound healing. Pt discharged  with antibiotics and received a dose of IM Ancef in the ED.  We discussed the risks and benefits of absorbable versus nonabsorbable sutures and he elected to proceed with repair with absorbable sutures.  Discussed wound care with patient and his wife and answered any additional questions.  He was given referral to hand surgery if he has any ongoing issues.  Discussed strict  ED return precautions.  Patient and patient's wife verbalized understanding of and agreement with plan and patient is stable for discharge home at this time.  Final Clinical Impressions(s) / ED Diagnoses   Final diagnoses:  Open fracture of tuft of distal phalanx of finger  Subungual hematoma of digit of hand, initial encounter    ED Discharge Orders         Ordered    cephALEXin (KEFLEX) 500 MG capsule  4 times daily  04/06/18 1845    traMADol (ULTRAM) 50 MG tablet  Every 12 hours PRN     04/06/18 1845           Renita Papa, PA-C 04/07/18 0140    Valarie Merino, MD 04/14/18 [phone]

## 2018-04-06 NOTE — ED Notes (Signed)
Suture cart at bedside 

## 2018-04-06 NOTE — Discharge Instructions (Addendum)
1. Medications: Please take all of your antibiotics until finished!   You may develop abdominal discomfort or diarrhea from the antibiotic.  You may help offset this with probiotics which you can buy or get in yogurt. Do not eat  or take the probiotics until 2 hours after your antibiotic.  Alternate 600 mg of ibuprofen and [phone] mg of Tylenol every 3 hours as needed for pain. Do not exceed 4000 mg of Tylenol daily.  Take ibuprofen with food to avoid upset stomach issues.  You can take tramadol as needed for severe pain but do not drive, drink alcohol, or operate heavy machinery while taking this medicine as it may make you drowsy. 2. Treatment: ice for swelling, keep wound clean with warm soap and water and keep bandage dry, do not submerge in water for 24 hours.  Pat the area dry instead of rubbing after washing her hands or taking a shower. 3. Follow Up: Your sutures will absorb in about 7 to 10 days.  You can follow-up with hand surgery if you have any ongoing problems.  Return to the emergency department sooner if any concerning signs or symptoms develop such as high fevers, redness, drainage of pus from the wound, or swelling.   WOUND CARE  Keep area clean and dry for 24 hours. Do not remove bandage, if applied.  After 24 hours, remove bandage and wash wound gently with mild soap and warm water. Reapply a new bandage after cleaning wound, if directed.   Continue daily cleansing with soap and water until stitches/staples are removed.  Do not apply any ointments or creams to the wound while stitches/staples are in place, as this may cause delayed healing. Return if you experience any of the following signs of infection: Swelling, redness, pus drainage, streaking, fever >101.0 F  Return if you experience excessive bleeding that does not stop after 15-20 minutes of constant, firm pressure.

## 2018-04-06 NOTE — ED Triage Notes (Signed)
Pt had two pieces of wood fall on his left middle finger. Last tetanus >10.

## 2018-04-14 DIAGNOSIS — S62633B Displaced fracture of distal phalanx of left middle finger, initial encounter for open fracture: Secondary | ICD-10-CM | POA: Diagnosis not present

## 2018-04-28 DIAGNOSIS — S62633D Displaced fracture of distal phalanx of left middle finger, subsequent encounter for fracture with routine healing: Secondary | ICD-10-CM | POA: Diagnosis not present

## 2018-05-28 DIAGNOSIS — S62633D Displaced fracture of distal phalanx of left middle finger, subsequent encounter for fracture with routine healing: Secondary | ICD-10-CM | POA: Diagnosis not present

## 2018-07-23 ENCOUNTER — Encounter: Payer: Self-pay | Admitting: Cardiology

## 2018-07-27 DIAGNOSIS — T1512XA Foreign body in conjunctival sac, left eye, initial encounter: Secondary | ICD-10-CM | POA: Diagnosis not present

## 2018-07-27 DIAGNOSIS — H04122 Dry eye syndrome of left lacrimal gland: Secondary | ICD-10-CM | POA: Diagnosis not present

## 2018-08-04 DIAGNOSIS — L720 Epidermal cyst: Secondary | ICD-10-CM | POA: Diagnosis not present

## 2018-08-04 DIAGNOSIS — L821 Other seborrheic keratosis: Secondary | ICD-10-CM | POA: Diagnosis not present

## 2018-08-04 DIAGNOSIS — D171 Benign lipomatous neoplasm of skin and subcutaneous tissue of trunk: Secondary | ICD-10-CM | POA: Diagnosis not present

## 2018-08-07 DIAGNOSIS — E782 Mixed hyperlipidemia: Secondary | ICD-10-CM | POA: Diagnosis not present

## 2018-08-07 DIAGNOSIS — R739 Hyperglycemia, unspecified: Secondary | ICD-10-CM | POA: Diagnosis not present

## 2018-08-07 DIAGNOSIS — E669 Obesity, unspecified: Secondary | ICD-10-CM | POA: Diagnosis not present

## 2018-08-07 DIAGNOSIS — Z0000 Encounter for general adult medical examination without abnormal findings: Secondary | ICD-10-CM | POA: Diagnosis not present

## 2018-08-07 DIAGNOSIS — E291 Testicular hypofunction: Secondary | ICD-10-CM | POA: Diagnosis not present

## 2018-08-07 DIAGNOSIS — Z1159 Encounter for screening for other viral diseases: Secondary | ICD-10-CM | POA: Diagnosis not present

## 2018-08-14 ENCOUNTER — Ambulatory Visit: Payer: Commercial Managed Care - PPO | Admitting: Cardiology

## 2018-08-14 ENCOUNTER — Encounter: Payer: Self-pay | Admitting: Cardiology

## 2018-08-14 ENCOUNTER — Encounter (INDEPENDENT_AMBULATORY_CARE_PROVIDER_SITE_OTHER): Payer: Self-pay

## 2018-08-14 VITALS — BP 124/76 | HR 86 | Ht 73.5 in | Wt 257.0 lb

## 2018-08-14 DIAGNOSIS — Z87891 Personal history of nicotine dependence: Secondary | ICD-10-CM

## 2018-08-14 DIAGNOSIS — R0789 Other chest pain: Secondary | ICD-10-CM

## 2018-08-14 DIAGNOSIS — E669 Obesity, unspecified: Secondary | ICD-10-CM

## 2018-08-14 DIAGNOSIS — E782 Mixed hyperlipidemia: Secondary | ICD-10-CM

## 2018-08-14 NOTE — Patient Instructions (Signed)
Medication Instructions:  The current medical regimen is effective;  continue present plan and medications.  If you need a refill on your cardiac medications before your next appointment, please call your pharmacy.   Testing/Procedures: Your physician has requested that you have an exercise tolerance test. For further information please visit [email or web address]. Please also follow instruction sheet, as given.   Follow-Up: Follow up as needed after the above testing.  Thank you for choosing [facility]!!

## 2018-08-14 NOTE — Progress Notes (Signed)
Cardiology Office Note:    Date:  08/15/2018   ID:  Jamie Holland, DOB [birth year], MRN [id number]  PCP:  Vernie Shanks, MD  Cardiologist:  Candee Furbish, MD  Electrophysiologist:  None   Referring MD: Lawerance Cruel, MD     History of Present Illness:    Jamie Holland is a 48 y.o. male here for the evaluation of fatigue with hyperlipidemia, erectile dysfunction for possible cardiac etiology.  He has had more fatigue after working long hours.  Had concerned over cardiac condition.  No smoking, no exertional dyspnea.  He did smoke several years ago quit in 2010.   Increased stress. Feels like needs to take a deep breath.  Sometimes has a strange sensation in his chest when he is laying down.  Overall he does not feel any exertional chest discomfort.  Father MI 35, Grandfather died MI after spitting wood.   Denies any fevers chills nausea vomiting syncope bleeding.  Past Medical History:  Diagnosis Date  . Combined hyperlipidemia   . Erectile dysfunction    UNSPECIFIED  . Fatigue   . Hypercholesterolemia   . Hyperglycemia   . Low back pain   . Low testosterone   . Lump of skin of back   . Maxillary sinusitis   . Obesity    UNSPECIFIED  . Perennial allergic rhinitis     Past Surgical History:  Procedure Laterality Date  . KNEE ARTHROSCOPY Left    x2  . NASAL SINUS SURGERY Left 07/03/2015   Procedure: LEFT MAXILLLARY SINUS ENDOSCOPIC SINUS SURGERY;  Surgeon: Izora Gala, MD;  Location: [facility];  Service: ENT;  Laterality: Left;    Current Medications: No outpatient medications have been marked as taking for the 08/14/18 encounter (Office Visit) with Jerline Pain, MD.     Allergies:   Patient has no known allergies.   Social History   Socioeconomic History  . Marital status: Married    Spouse name: Not on file  . Number of children: Not on file  . Years of education: Not on file  . Highest education level: Not on file  Occupational  History  . Not on file  Social Needs  . Financial resource strain: Not on file  . Food insecurity:    Worry: Not on file    Inability: Not on file  . Transportation needs:    Medical: Not on file    Non-medical: Not on file  Tobacco Use  . Smoking status: Former Research scientist (life sciences)  . Smokeless tobacco: Never Used  Substance and Sexual Activity  . Alcohol use: Yes    Comment: social  . Drug use: No  . Sexual activity: Not on file  Lifestyle  . Physical activity:    Days per week: Not on file    Minutes per session: Not on file  . Stress: Not on file  Relationships  . Social connections:    Talks on phone: Not on file    Gets together: Not on file    Attends religious service: Not on file    Active member of club or organization: Not on file    Attends meetings of clubs or organizations: Not on file    Relationship status: Not on file  Other Topics Concern  . Not on file  Social History Narrative  . Not on file     Family History: The patient's family history includes Allergies in his mother; Cancer in his mother; Heart disease in  his father; Hypertension in his father; Migraines in his mother.  ROS:   Please see the history of present illness.     All other systems reviewed and are negative.  EKGs/Labs/Other Studies Reviewed:    The following studies were reviewed today:  LDL 131 triglycerides 161 HDL 42 total cholesterol 206 with creatinine of 0.99 TSH 1.7  EKG:  EKG is  ordered today.  The ekg ordered today demonstrates 08/14/2018-normal sinus rhythm 80 with no other significant abnormalities personally reviewed and interpreted.  Recent Labs: 11/25/2017: BUN 16; Creatinine, Ser 1.24; Hemoglobin 15.1; Platelets 181; Potassium 4.5; Sodium 140  Recent Lipid Panel No results found for: CHOL, TRIG, HDL, CHOLHDL, VLDL, LDLCALC, LDLDIRECT  Physical Exam:    VS:  BP 124/76   Pulse 86   Ht 6' 1.5" (1.867 m)   Wt 257 lb (116.6 kg)   SpO2 95%   BMI 33.45 kg/m     Wt Readings  from Last 3 Encounters:  08/14/18 257 lb (116.6 kg)  11/25/17 250 lb (113.4 kg)  07/03/15 244 lb 4 oz (110.8 kg)     GEN:  Well nourished, well developed in no acute distress HEENT: Normal NECK: No JVD; No carotid bruits LYMPHATICS: No lymphadenopathy CARDIAC: RRR, no murmurs, rubs, gallops RESPIRATORY:  Clear to auscultation without rales, wheezing or rhonchi  ABDOMEN: Soft, non-tender, non-distended MUSCULOSKELETAL:  No edema; No deformity  SKIN: Warm and dry NEUROLOGIC:  Alert and oriented x 3 PSYCHIATRIC:  Normal affect   ASSESSMENT:    1. Chest pain, atypical   2. Mixed hyperlipidemia   3. Former smoker   4. Obesity (BMI 30-39.9)    PLAN:    In order of problems listed above:  Atypical chest pain - Sensation of strange feeling in his chest, sensation to take in a deep breath when he lays down sometimes. - Because of this and his family history with his father and grandfather, I will proceed with an exercise treadmill test for further risk ratification. -I offered him the possibility of a CT coronary calcium score but desired not to proceed. - Also discussed with him his LDL of 131, if he did have evidence of coronary plaque to consider decreasing that to less than 70 with statin therapy.  For now he will continue with diet and exercise.  Hyperlipidemia - Continue with diet and exercise for now.  Discussed with him that a calcium score would be helpful to help guide our statin therapy and his situation with his family history.  For now he wishes to decline.  Obesity -Continue to encourage weight loss.  Still enjoys beer.  Former smoker - Excellent, quit smoking 10 years ago.   Medication Adjustments/Labs and Tests Ordered: Current medicines are reviewed at length with the patient today.  Concerns regarding medicines are outlined above.  Orders Placed This Encounter  Procedures  . EXERCISE TOLERANCE TEST (ETT)  . EKG 12-Lead   No orders of the defined types were  placed in this encounter.   Patient Instructions  Medication Instructions:  The current medical regimen is effective;  continue present plan and medications.  If you need a refill on your cardiac medications before your next appointment, please call your pharmacy.   Testing/Procedures: Your physician has requested that you have an exercise tolerance test. For further information please visit [email or web address]. Please also follow instruction sheet, as given.   Follow-Up: Follow up as needed after the above testing.  Thank you for choosing [facility]!!  Signed, Candee Furbish, MD  08/15/2018 7:14 AM    [facility]

## 2018-08-15 ENCOUNTER — Encounter: Payer: Self-pay | Admitting: Cardiology

## 2019-05-31 DIAGNOSIS — M25562 Pain in left knee: Secondary | ICD-10-CM | POA: Insufficient documentation

## 2019-10-28 ENCOUNTER — Telehealth: Payer: Self-pay

## 2019-10-28 NOTE — Telephone Encounter (Signed)
NOTES ON FILE FROM [facility] [phone] SENT REFERRAL TO SCHEDULING

## 2019-11-15 ENCOUNTER — Encounter: Payer: Self-pay | Admitting: General Practice

## 2020-02-02 IMAGING — CT CT RENAL STONE PROTOCOL
2 of 4 series · 16 of 46 positions shown, 18 images · non-contrast
Comparison: None

CLINICAL DATA: LEFT flank pain since 9999 hours, intermittent
severity radiating to LEFT testicle, no prior history of kidney
stones

EXAM:
CT ABDOMEN AND PELVIS WITHOUT CONTRAST
TECHNIQUE: Multidetector CT imaging of the abdomen and pelvis was performed
following the standard protocol without IV contrast. Sagittal and
coronal MPR images reconstructed from axial data set. Oral contrast
not administered for this indication.

[Series 2: axial st · axial · 0.88mm/px · z∈[+1192,+1672]mm · 13 of 108 slices shown, 15 images]
[im 6/108  soft-tissue]
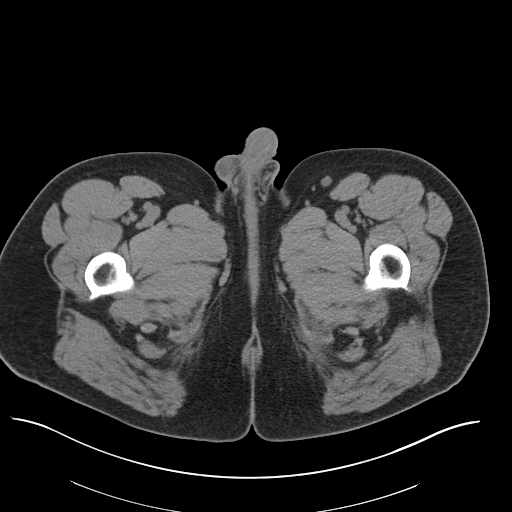
[im 6/108  bone]
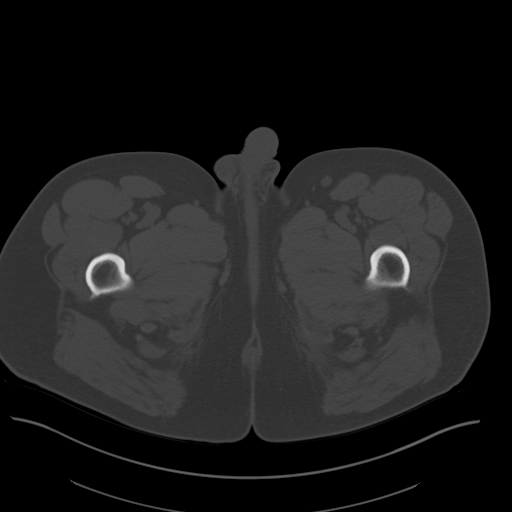
[im 16/108  soft-tissue]
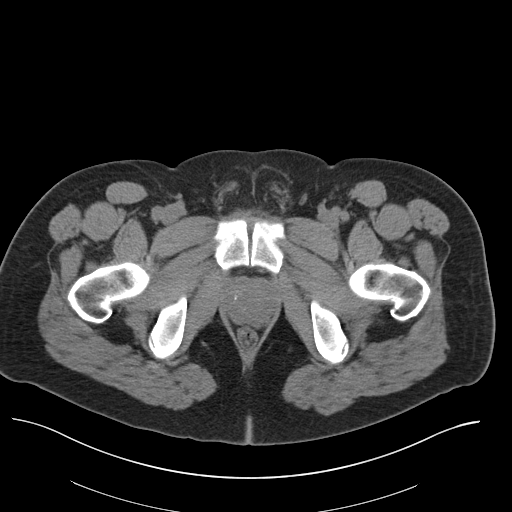
[im 21/108  soft-tissue]
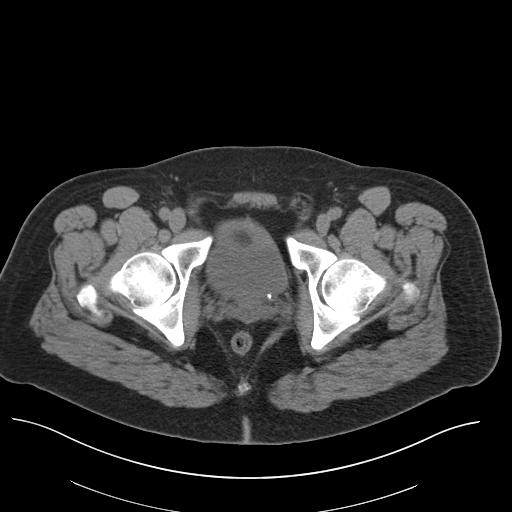
[im 31/108  soft-tissue]
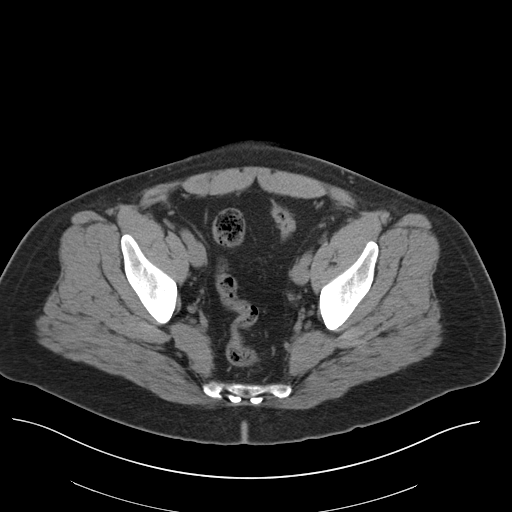
[im 36/108  soft-tissue]
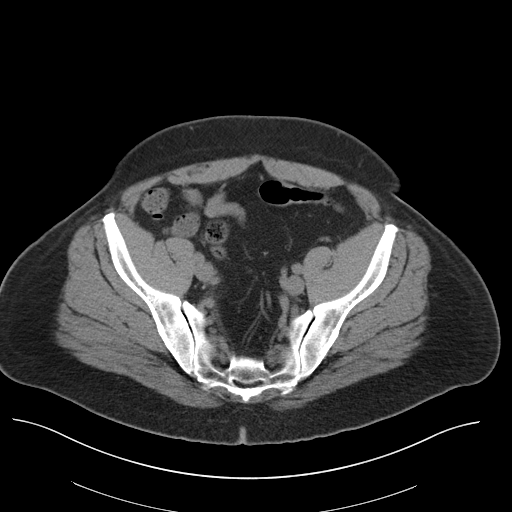
[im 46/108  soft-tissue]
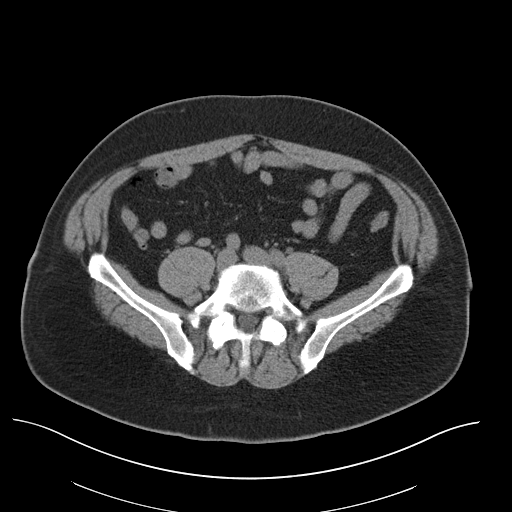
[im 57/108  soft-tissue]
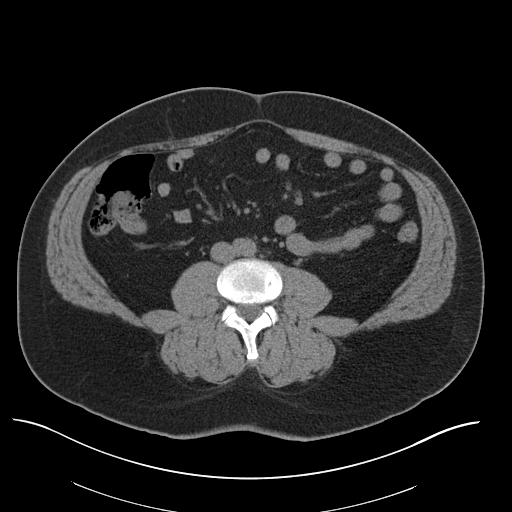
[im 62/108  soft-tissue]
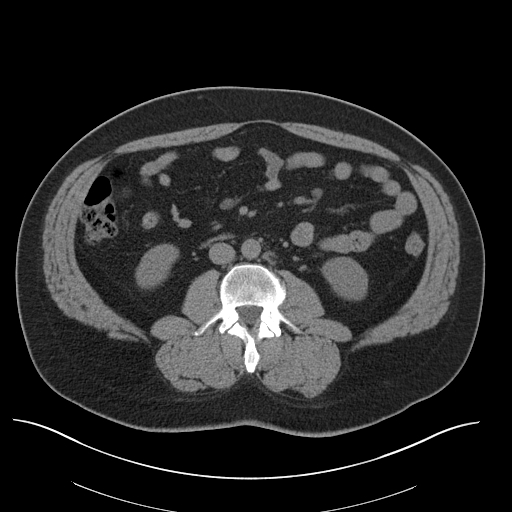
[im 72/108  soft-tissue]
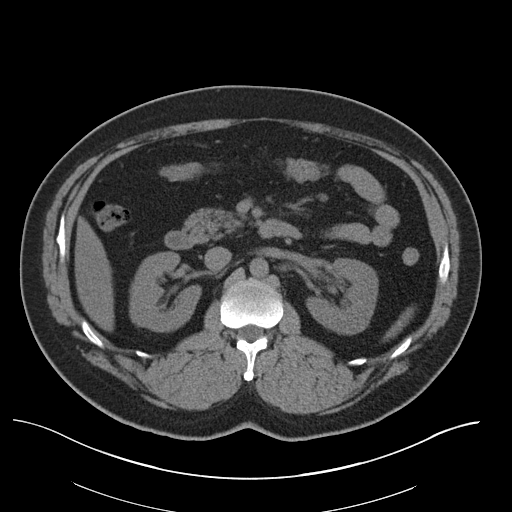
[im 72/108  bone]
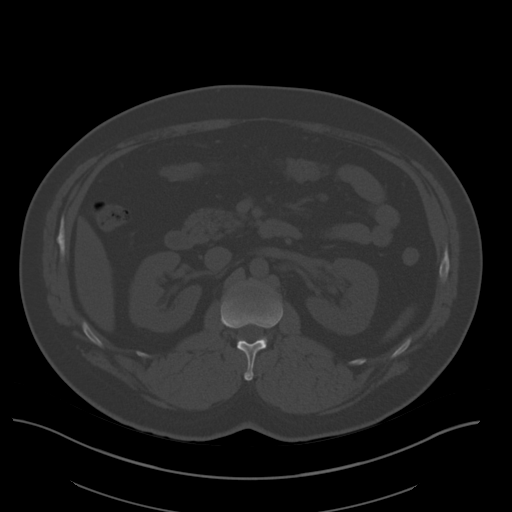
[im 77/108  soft-tissue]
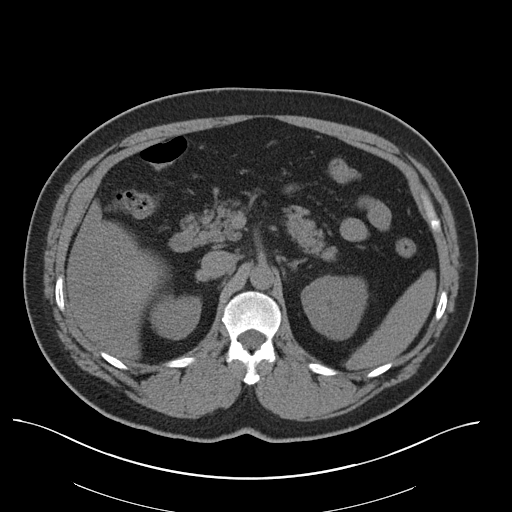
[im 87/108  soft-tissue]
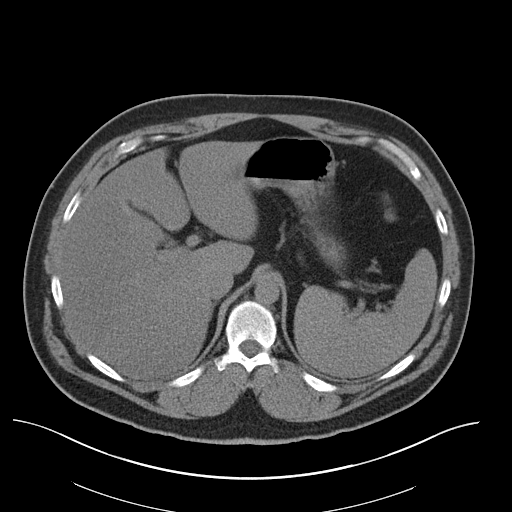
[im 92/108  soft-tissue]
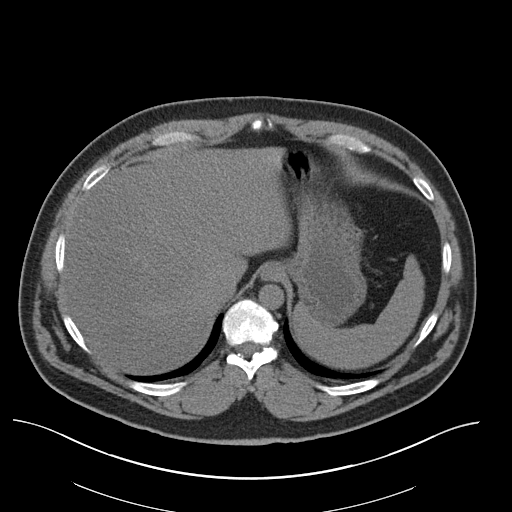
[im 102/108  soft-tissue]
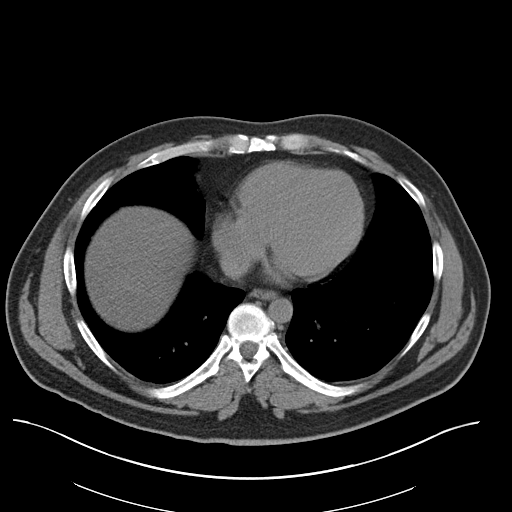

[Series 5: coronal · coronal · 0.88mm/px · 3 of 164 slices shown]
[im 55/164  soft-tissue]
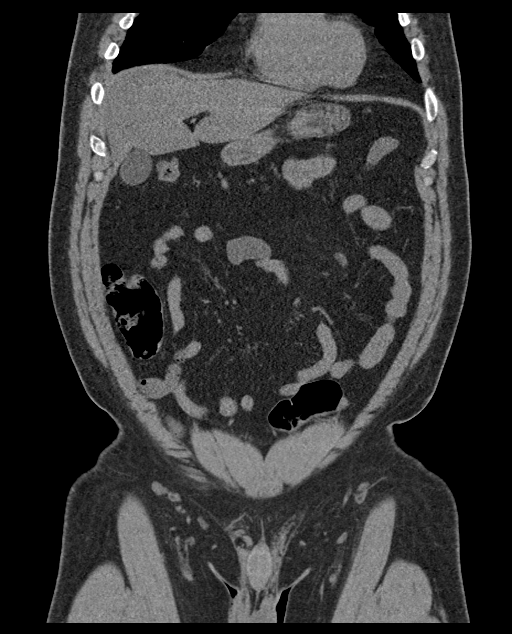
[im 73/164  soft-tissue]
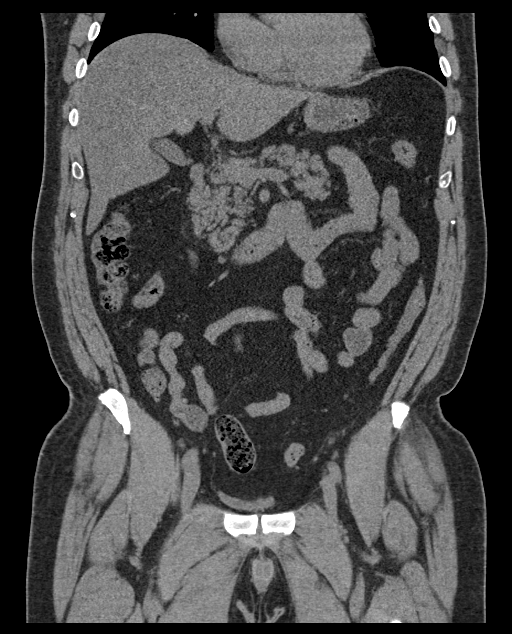
[im 91/164  soft-tissue]
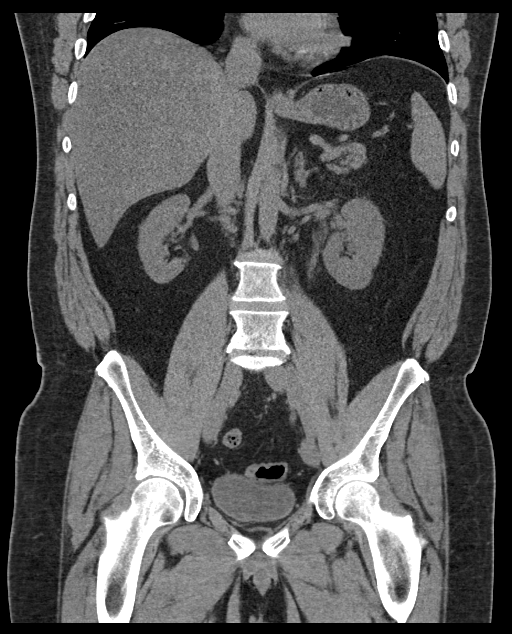

[16 of 46 positions shown; findings below may reference images not displayed]

FINDINGS: Lower chest: Lung bases clear

Hepatobiliary: Fatty infiltration of liver. Gallbladder and liver
otherwise unremarkable

Pancreas: Normal appearance

Spleen: Normal appearance

Adrenals/Urinary Tract: Adrenal glands normal appearance. RIGHT
kidney and ureter normal appearance. LEFT hydronephrosis and
hydroureter secondary to a 4 mm calculus at the LEFT ureterovesical
junction. LEFT kidney otherwise unremarkable. Remainder of bladder
normal appearance.

Stomach/Bowel: Normal appendix. Stomach and bowel loops normal
appearance for technique

Vascular/Lymphatic: Aorta normal caliber.  No adenopathy.

Reproductive: Mild prostatic enlargement, gland 5.1 x 3.9 cm image
91. Seminal vesicles unremarkable.

Other: No free air or free fluid. No hernia or inflammatory process.

Musculoskeletal: Degenerative disc disease changes greatest at
L4-L5.
IMPRESSION: LEFT hydronephrosis and hydroureter secondary to a 4 mm calculus at
the LEFT ureterovesical junction.

Fatty infiltration of liver.

Mild prostatic enlargement.

## 2020-05-23 ENCOUNTER — Other Ambulatory Visit: Payer: Self-pay | Admitting: *Deleted

## 2020-05-23 ENCOUNTER — Telehealth: Payer: Self-pay | Admitting: Cardiology

## 2020-05-23 DIAGNOSIS — R0789 Other chest pain: Secondary | ICD-10-CM

## 2020-05-23 DIAGNOSIS — R079 Chest pain, unspecified: Secondary | ICD-10-CM

## 2020-05-23 DIAGNOSIS — R0602 Shortness of breath: Secondary | ICD-10-CM

## 2020-05-23 NOTE — Telephone Encounter (Signed)
Pt in today with father for appt. Pt seen Dr. Marlou Porch in 2020 and was ordered to have GXT.  This was never completed due to covid.  Still having issues.  GXT reordered.  Pt to see [facility] after test.

## 2020-06-30 ENCOUNTER — Other Ambulatory Visit (HOSPITAL_COMMUNITY)
Admission: RE | Admit: 2020-06-30 | Discharge: 2020-06-30 | Disposition: A | Discharge status: Home / Self Care | Payer: Commercial Managed Care - PPO | Source: Ambulatory Visit | Attending: Cardiology | Admitting: Cardiology

## 2020-06-30 DIAGNOSIS — Z20822 Contact with and (suspected) exposure to covid-19: Secondary | ICD-10-CM | POA: Insufficient documentation

## 2020-06-30 DIAGNOSIS — Z01812 Encounter for preprocedural laboratory examination: Secondary | ICD-10-CM | POA: Insufficient documentation

## 2020-06-30 LAB — SARS CORONAVIRUS 2 (TAT 6-24 HRS): SARS Coronavirus 2: NEGATIVE

## 2020-07-04 ENCOUNTER — Ambulatory Visit (INDEPENDENT_AMBULATORY_CARE_PROVIDER_SITE_OTHER): Payer: Commercial Managed Care - PPO

## 2020-07-04 ENCOUNTER — Other Ambulatory Visit: Payer: Self-pay

## 2020-07-04 DIAGNOSIS — R0789 Other chest pain: Secondary | ICD-10-CM | POA: Diagnosis not present

## 2020-07-04 DIAGNOSIS — R0602 Shortness of breath: Secondary | ICD-10-CM

## 2020-07-04 LAB — EXERCISE TOLERANCE TEST
Estimated workload: 10.1 METS
Exercise duration (min): 9 min
Exercise duration (sec): 0 s
MPHR: 169 {beats}/min
Peak HR: 164 {beats}/min
Percent HR: 97 %
RPE: 16
Rest HR: 98 {beats}/min

## 2020-07-19 ENCOUNTER — Ambulatory Visit: Payer: Commercial Managed Care - PPO | Admitting: Cardiology

## 2020-08-17 ENCOUNTER — Ambulatory Visit: Payer: Commercial Managed Care - PPO | Admitting: Cardiology

## 2020-09-28 ENCOUNTER — Ambulatory Visit (INDEPENDENT_AMBULATORY_CARE_PROVIDER_SITE_OTHER): Payer: Commercial Managed Care - PPO | Admitting: Cardiology

## 2020-09-28 ENCOUNTER — Encounter: Payer: Self-pay | Admitting: Cardiology

## 2020-09-28 ENCOUNTER — Other Ambulatory Visit: Payer: Self-pay

## 2020-09-28 VITALS — BP 120/80 | HR 76 | Ht 73.5 in | Wt 253.0 lb

## 2020-09-28 DIAGNOSIS — E7800 Pure hypercholesterolemia, unspecified: Secondary | ICD-10-CM | POA: Diagnosis not present

## 2020-09-28 DIAGNOSIS — Z8249 Family history of ischemic heart disease and other diseases of the circulatory system: Secondary | ICD-10-CM | POA: Diagnosis not present

## 2020-09-28 NOTE — Progress Notes (Signed)
Cardiology Office Note:    Date:  10/02/2020   ID:  Jamie Holland, DOB 10-24-1970, MRN [id number]  PCP:  Vernie Shanks, MD   [place]  Cardiologist:  Jamie Furbish, MD  Advanced Practice Provider:  No care team member to display Electrophysiologist:  None      Referring MD: Vernie Shanks, MD     History of Present Illness:    Jamie Holland is a 50 y.o. male here for the follow-up of chest discomfort.  Has had fatigue in the past concerning for cardiac condition.  He smoked several years ago quit in 2010.  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  Father had a myocardial infarction at 39, grandfather died of MI after splitting wood. At prior visit offered him coronary calcium score did not wish to proceed.  Past Medical History:  Diagnosis Date  . Combined hyperlipidemia   . Erectile dysfunction    UNSPECIFIED  . Fatigue   . Hypercholesterolemia   . Hyperglycemia   . Low back pain   . Low testosterone   . Lump of skin of back   . Maxillary sinusitis   . Obesity    UNSPECIFIED  . Perennial allergic rhinitis     Past Surgical History:  Procedure Laterality Date  . KNEE ARTHROSCOPY Left    x2  . NASAL SINUS SURGERY Left 07/03/2015   Procedure: LEFT MAXILLLARY SINUS ENDOSCOPIC SINUS SURGERY;  Surgeon: Izora Gala, MD;  Location: [place];  Service: ENT;  Laterality: Left;    Current Medications: No outpatient medications have been marked as taking for the 09/28/20 encounter (Office Visit) with Jerline Pain, MD.     Allergies:   Patient has no known allergies.   Social History   Socioeconomic History  . Marital status: Married    Spouse name: Not on file  . Number of children: Not on file  . Years of education: Not on file  . Highest education level: Not on file  Occupational History  . Not on file  Tobacco Use  . Smoking status: Former Research scientist (life sciences)  . Smokeless tobacco: Never Used  Vaping Use  .  Vaping Use: Never used  Substance and Sexual Activity  . Alcohol use: Yes    Comment: social  . Drug use: No  . Sexual activity: Not on file  Other Topics Concern  . Not on file  Social History Narrative  . Not on file   Social Determinants of Health   Financial Resource Strain: Not on file  Food Insecurity: Not on file  Transportation Needs: Not on file  Physical Activity: Not on file  Stress: Not on file  Social Connections: Not on file     Family History: The patient's family history includes Allergies in his mother; Cancer in his mother; Heart disease in his father; Hypertension in his father; Migraines in his mother.  ROS:   Please see the history of present illness.    Denies any fevers chills nausea vomiting syncope bleeding all other systems reviewed and are negative.  EKGs/Labs/Other Studies Reviewed:      EKG:  EKG is  ordered today.  The ekg ordered today demonstrates sinus rhythm 76 no other abnormalities.  Recent Labs: No results found for requested labs within last 8760 hours.  Recent Lipid Panel No results found for: CHOL, TRIG, HDL, CHOLHDL, VLDL, LDLCALC, LDLDIRECT   Risk Assessment/Calculations:      Physical Exam:  VS:  BP 120/80 (BP Location: Left Arm, Patient Position: Sitting, Cuff Size: Normal)   Pulse 76   Ht 6' 1.5" (1.867 m)   Wt 253 lb (114.8 kg)   SpO2 95%   BMI 32.93 kg/m     Wt Readings from Last 3 Encounters:  09/28/20 253 lb (114.8 kg)  08/14/18 257 lb (116.6 kg)  11/25/17 250 lb (113.4 kg)     GEN:  Well nourished, well developed in no acute distress HEENT: Normal NECK: No JVD; No carotid bruits LYMPHATICS: No lymphadenopathy CARDIAC: RRR, no murmurs, rubs, gallops RESPIRATORY:  Clear to auscultation without rales, wheezing or rhonchi  ABDOMEN: Soft, non-tender, non-distended MUSCULOSKELETAL:  No edema; No deformity  SKIN: Warm and dry NEUROLOGIC:  Alert and oriented x 3 PSYCHIATRIC:  Normal affect    ASSESSMENT:    1. Family history of coronary artery disease   2. Pure hypercholesterolemia    PLAN:    In order of problems listed above:  Family history of CAD -Both father and grandfather had CAD. -His wife suggested coronary calcium score, I agree.  This will help guide therapy. -We will go ahead and order $99. -He is not currently having any anginal symptoms, no significant shortness of breath.  Hyperlipidemia -LDL is slightly improved from 130 down to 120 at last check last year.  Course of coronary calcium is noted on CT scan, we will likely encourage use of Crestor as well as continue dietary and exercise modification.  Weight loss -He did describe that he was down to 217 pounds last year during his child's wedding.  He will continue to work on decreasing carbohydrates etc.  Former smoker -Quit in 2010.  Has not picked up a cigarette since then.  Excellent.    Medication Adjustments/Labs and Tests Ordered: Current medicines are reviewed at length with the patient today.  Concerns regarding medicines are outlined above.  Orders Placed This Encounter  Procedures  . CT CARDIAC SCORING (SELF PAY ONLY)  . EKG 12-Lead   No orders of the defined types were placed in this encounter.   Patient Instructions  Medication Instructions:  The current medical regimen is effective;  continue present plan and medications.  *If you need a refill on your cardiac medications before your next appointment, please call your pharmacy*  Testing/Procedures: Your physician has requested that you have Coronary Calcium score which is completed by CT. Cardiac computed tomography (CT) is a painless test that uses an x-ray machine to take clear, detailed pictures of your heart.  This testing is completed here at our office.  The cost is $99 due at the time of service.  There are no instructions/restrictions for this test.  Follow-Up: At [facility], you and your health needs are our  priority.  As part of our continuing mission to provide you with exceptional heart care, we have created designated Provider Care Teams.  These Care Teams include your primary Cardiologist (physician) and Advanced Practice Providers (APPs -  Physician Assistants and Nurse Practitioners) who all work together to provide you with the care you need, when you need it.  We recommend signing up for the patient portal called "MyChart".  Sign up information is provided on this After Visit Summary.  MyChart is used to connect with patients for Virtual Visits (Telemedicine).  Patients are able to view lab/test results, encounter notes, upcoming appointments, etc.  Non-urgent messages Holland be sent to your provider as well.   To learn more about what you  can do with MyChart, go to [email or web address].    Your next appointment:   12 month(s)  The format for your next appointment:   In Person  Provider:   Candee Furbish, MD   Thank you for choosing [facility]!!         Signed, Jamie Furbish, MD  10/02/2020 4:13 PM    [facility]

## 2020-09-28 NOTE — Patient Instructions (Signed)
Medication Instructions:  The current medical regimen is effective;  continue present plan and medications.  *If you need a refill on your cardiac medications before your next appointment, please call your pharmacy*  Testing/Procedures: Your physician has requested that you have Coronary Calcium score which is completed by CT. Cardiac computed tomography (CT) is a painless test that uses an x-ray machine to take clear, detailed pictures of your heart.  This testing is completed here at our office.  The cost is $99 due at the time of service.  There are no instructions/restrictions for this test.  Follow-Up: At [facility], you and your health needs are our priority.  As part of our continuing mission to provide you with exceptional heart care, we have created designated Provider Care Teams.  These Care Teams include your primary Cardiologist (physician) and Advanced Practice Providers (APPs -  Physician Assistants and Nurse Practitioners) who all work together to provide you with the care you need, when you need it.  We recommend signing up for the patient portal called "MyChart".  Sign up information is provided on this After Visit Summary.  MyChart is used to connect with patients for Virtual Visits (Telemedicine).  Patients are able to view lab/test results, encounter notes, upcoming appointments, etc.  Non-urgent messages can be sent to your provider as well.   To learn more about what you can do with MyChart, go to [email or web address].    Your next appointment:   12 month(s)  The format for your next appointment:   In Person  Provider:   Candee Furbish, MD   Thank you for choosing [facility]!!

## 2020-11-02 ENCOUNTER — Emergency Department (HOSPITAL_COMMUNITY)
Admission: EM | Admit: 2020-11-02 | Discharge: 2020-11-03 | Disposition: A | Discharge status: Home / Self Care | Payer: Commercial Managed Care - PPO | Attending: Emergency Medicine | Admitting: Emergency Medicine

## 2020-11-02 ENCOUNTER — Other Ambulatory Visit: Payer: Self-pay

## 2020-11-02 DIAGNOSIS — Y990 Civilian activity done for income or pay: Secondary | ICD-10-CM | POA: Insufficient documentation

## 2020-11-02 DIAGNOSIS — X501XXA Overexertion from prolonged static or awkward postures, initial encounter: Secondary | ICD-10-CM | POA: Insufficient documentation

## 2020-11-02 DIAGNOSIS — Z87891 Personal history of nicotine dependence: Secondary | ICD-10-CM | POA: Diagnosis not present

## 2020-11-02 DIAGNOSIS — S39012A Strain of muscle, fascia and tendon of lower back, initial encounter: Secondary | ICD-10-CM | POA: Diagnosis not present

## 2020-11-02 DIAGNOSIS — S3992XA Unspecified injury of lower back, initial encounter: Secondary | ICD-10-CM | POA: Diagnosis present

## 2020-11-02 NOTE — ED Triage Notes (Addendum)
Pt arrived by [facility] from home. Reports back pain x 2 days, no injury and no hx of same. Recently started going to chiropractor again due to pain.

## 2020-11-02 NOTE — ED Provider Notes (Signed)
Emergency Medicine Provider Triage Evaluation Note  Jamie Holland , a 50 y.o. male  was evaluated in triage.  Pt complains of mid/left lower back pain that started 2 days ago. He was adjusted by a chiropractor 2 days ago and since then sxs have worsened. States he has been unable to stand up or sit since then. Denies loss of control of bowel or bladder function. Denies any h/o ivdu or ca. Denies fevers. Laying down pain is 3/10. With movement pain severe. States he thinks he twisted wrong [place].   Review of Systems  Positive: Back pain Negative: Loss of control of bowel or bladder function  Physical Exam  BP 133/81 (BP Location: Right Arm)   Pulse 64   Temp 98.2 F (36.8 C) (Oral)   Resp 16   SpO2 98%  Gen:   Awake, no distress   Resp:  Normal effort  MSK:   Moves extremities without difficulty  Other:  ttp to the lumbar spine and to the left lateral paraspinous muscles, 5/5 strength to the ble, normal sensation, normal DP pulses bilat  Medical Decision Making  Medically screening exam initiated at 2:07 PM.  Appropriate orders placed.  LEWAYNE PAULEY was informed that the remainder of the evaluation will be completed by another provider, this initial triage assessment does not replace that evaluation, and the importance of remaining in the ED until their evaluation is complete.    Rodney Booze, PA-C 11/02/20 1412    Deno Etienne, DO 11/02/20 1533

## 2020-11-03 ENCOUNTER — Emergency Department (HOSPITAL_COMMUNITY): Payer: Commercial Managed Care - PPO

## 2020-11-03 MED ORDER — DEXAMETHASONE 4 MG PO TABS
6.0000 mg | ORAL_TABLET | Freq: Once | ORAL | Status: AC
  Administered 2020-11-03: 6 mg via ORAL
  Filled 2020-11-03: qty 2

## 2020-11-03 MED ORDER — KETOROLAC TROMETHAMINE 60 MG/2ML IM SOLN
15.0000 mg | Freq: Once | INTRAMUSCULAR | Status: AC
  Administered 2020-11-03: 15 mg via INTRAMUSCULAR
  Filled 2020-11-03: qty 2

## 2020-11-03 MED ORDER — KETOROLAC TROMETHAMINE 60 MG/2ML IM SOLN
30.0000 mg | Freq: Once | INTRAMUSCULAR | Status: AC
Start: 2020-11-03 — End: 2020-11-03
  Administered 2020-11-03: 30 mg via INTRAMUSCULAR
  Filled 2020-11-03: qty 2

## 2020-11-03 MED ORDER — CYCLOBENZAPRINE HCL 10 MG PO TABS: 5.0000 mg | ORAL_TABLET | Freq: Every day | ORAL | 0 refills | Status: AC

## 2020-11-03 MED ORDER — LIDOCAINE HCL (PF) 1 % IJ SOLN
INTRAMUSCULAR | Status: AC
  Filled 2020-11-03: qty 5

## 2020-11-03 MED ORDER — BUPIVACAINE HCL 0.5 % IJ SOLN
20.0000 mL | Freq: Once | INTRAMUSCULAR | Status: AC
  Administered 2020-11-03: 20 mL
  Filled 2020-11-03 (×2): qty 20

## 2020-11-03 NOTE — ED Notes (Signed)
Pt ambulated to the restroom with walker.

## 2020-11-03 NOTE — ED Provider Notes (Addendum)
[facility] EMERGENCY DEPARTMENT Provider Note  CSN: [id number] Arrival date & time: 11/02/20 1353  Chief Complaint(s) Back Pain  HPI Jamie Holland is a 50 y.o. male who presents to the emergency department with several days of gradually worsening left lumbosacral pain felts after a lot of bending and twisting at work.  Pain is worse with movement and palpation of the left paraspinal musculature in the lumbar sacral region.  Alleviated by immobility.  Patient reports that he went to the chiropractor 2 days ago to be adjusted but the pain became worse after that.  He denies any bladder/bowel incontinence.  No lower extremity weakness or loss of sensation.  Pain now is severe limiting his mobility and ability to stand up straight.  Pain radiates from the lumbosacral region up to the lumbar region.  No radiation down to the lower extremities.   He denies any related falls or trauma.  No fever.  No IV drug use.  No history of cancer. HPI  Past Medical History Past Medical History:  Diagnosis Date  . Combined hyperlipidemia   . Erectile dysfunction    UNSPECIFIED  . Fatigue   . Hypercholesterolemia   . Hyperglycemia   . Low back pain   . Low testosterone   . Lump of skin of back   . Maxillary sinusitis   . Obesity    UNSPECIFIED  . Perennial allergic rhinitis    There are no problems to display for this patient.  Home Medication(s) Prior to Admission medications   Medication Sig Start Date End Date Taking? Authorizing Provider  cyclobenzaprine (FLEXERIL) 10 MG tablet Take 0.5-1 tablets (5-10 mg total) by mouth at bedtime for 10 days. 11/03/20 11/13/20 Yes Delvin Hedeen, Grayce Sessions, MD                                                                                                                                    Past Surgical History Past Surgical History:  Procedure Laterality Date  . KNEE ARTHROSCOPY Left    x2  . NASAL SINUS SURGERY Left 07/03/2015   Procedure:  LEFT MAXILLLARY SINUS ENDOSCOPIC SINUS SURGERY;  Surgeon: Izora Gala, MD;  Location: [place];  Service: ENT;  Laterality: Left;   Family History Family History  Problem Relation Age of Onset  . Allergies Mother   . Cancer Mother   . Migraines Mother   . Heart disease Father   . Hypertension Father     Social History Social History   Tobacco Use  . Smoking status: Former Research scientist (life sciences)  . Smokeless tobacco: Never Used  Vaping Use  . Vaping Use: Never used  Substance Use Topics  . Alcohol use: Yes    Comment: social  . Drug use: No   Allergies Patient has no known allergies.  Review of Systems Review of Systems All other systems are reviewed and are negative for acute change except  as noted in the HPI  Physical Exam Vital Signs  I have reviewed the triage vital signs BP 121/68 (BP Location: Right Arm)   Pulse 60   Temp 98.2 F (36.8 C) (Oral)   Resp 18   SpO2 99%   Physical Exam Vitals reviewed.  Constitutional:      General: He is not in acute distress.    Appearance: He is well-developed. He is not diaphoretic.  HENT:     Head: Normocephalic and atraumatic.     Jaw: No trismus.     Right Ear: External ear normal.     Left Ear: External ear normal.     Nose: Nose normal.  Eyes:     General: No scleral icterus.    Conjunctiva/sclera: Conjunctivae normal.  Neck:     Trachea: Phonation normal.  Cardiovascular:     Rate and Rhythm: Normal rate and regular rhythm.  Pulmonary:     Effort: Pulmonary effort is normal. No respiratory distress.     Breath sounds: No stridor.  Abdominal:     General: There is no distension.  Musculoskeletal:        General: Normal range of motion.     Cervical back: Normal range of motion.     Lumbar back: Spasms and tenderness present. No bony tenderness.       Back:  Neurological:     Mental Status: He is alert and oriented to person, place, and time.     Comments: Spine Exam: Strength: 5/5 throughout LE  bilaterally  Sensation: Intact to light touch in proximal and distal LE bilaterally    Psychiatric:        Behavior: Behavior normal.     ED Results and Treatments Labs (all labs ordered are listed, but only abnormal results are displayed) Labs Reviewed - No data to display                                                                                                                       EKG  EKG Interpretation  Date/Time:    Ventricular Rate:    PR Interval:    QRS Duration:   QT Interval:    QTC Calculation:   R Axis:     Text Interpretation:        Radiology DG Lumbar Spine 2-3 Views  Result Date: 11/03/2020 CLINICAL DATA:  50 year old male with low back pain. EXAM: LUMBAR SPINE - 2-3 VIEW COMPARISON:  CT of the abdomen pelvis dated 11/25/2017. FINDINGS: There is no acute fracture or subluxation of the lumbar spine. Mild degenerative changes and spurring. The visualized posterior elements are intact. The soft tissues are unremarkable. IMPRESSION: No acute/traumatic lumbar spine pathology. Electronically Signed   By: Anner Crete M.D.   On: 11/03/2020 01:18    Pertinent labs & imaging results that were available during my care of the patient were reviewed by me and considered in my medical decision making (see chart for details).  Medications Ordered in  ED Medications  ketorolac (TORADOL) injection 15 mg (has no administration in time range)  ketorolac (TORADOL) injection 30 mg (30 mg Intramuscular Given 11/03/20 0045)  bupivacaine (MARCAINE) 0.5 % (with pres) injection 20 mL (20 mLs Infiltration Given by Other 11/03/20 0247)  dexamethasone (DECADRON) tablet 6 mg (6 mg Oral Given 11/03/20 0045)                                                                                                                                    Procedures Procedures Procedure Note: Trigger Point Injection for Myofascial pain  Procedure Note: Trigger Point Injection for Myofascial  pain  Performed by Dr. Eudelia Bunch Indication: muscle/myofascial pain Muscle body and tendon sheath of the left lumbar paraspinal muscle(s) were injected with 0.5% bupivacaine under sterile technique for release of muscle spasm/pain. Patient tolerated well with immediate improvement of symptoms and no immediate complications following procedure.  CPT Code:  3 or more: 16109   (including critical care time)  Medical Decision Making / ED Course I have reviewed the nursing notes for this encounter and the patient's prior records (if available in EHR or on provided paperwork).   Jamie Holland was evaluated in Emergency Department on 11/03/2020 for the symptoms described in the history of present illness. He was evaluated in the context of the global COVID-19 pandemic, which necessitated consideration that the patient might be at risk for infection with the SARS-CoV-2 virus that causes COVID-19. Institutional protocols and algorithms that pertain to the evaluation of patients at risk for COVID-19 are in a state of rapid change based on information released by regulatory bodies including the CDC and federal and state organizations. These policies and algorithms were followed during the patient's care in the ED.  50 y.o. male presents with back pain in lumbar area for several days without signs of radicular pain. No acute traumatic onset. No red flag symptoms of fever, weight loss, saddle anesthesia, weakness, fecal/urinary incontinence or urinary retention.   Plain film w/o fracture/dislocation or bony lesion.  Offered local trigger point injection for symptomatic treatment.   Suspect MSK etiology. Patient was recommended to take short course of scheduled NSAIDs and engage in early mobility as definitive treatment. Return precautions discussed for worsening or new concerning symptoms.        Final Clinical Impression(s) / ED Diagnoses Final diagnoses:  Strain of lumbar region, initial encounter    The patient appears reasonably screened and/or stabilized for discharge and I doubt any other medical condition or other [facility] requiring further screening, evaluation, or treatment in the ED at this time prior to discharge. Safe for discharge with strict return precautions.  Disposition: Discharge  Condition: Good  I have discussed the results, Dx and Tx plan with the patient/family who expressed understanding and agree(s) with the plan. Discharge instructions discussed at length. The patient/family was given strict return precautions who verbalized understanding of the instructions. No further  questions at time of discharge.    ED Discharge Orders         Ordered    cyclobenzaprine (FLEXERIL) 10 MG tablet  Daily at bedtime        11/03/20 0319           Follow Up: Vernie Shanks, MD [place] [address] [phone]  Call  as needed      This chart was dictated using voice recognition software.  Despite best efforts to proofread,  errors can occur which can change the documentation meaning.     Fatima Blank, MD 11/03/20 [phone]

## 2020-11-03 NOTE — Discharge Instructions (Addendum)
You may use over-the-counter Motrin (Ibuprofen) OR Aleve (Naproxen), Acetaminophen (Tylenol), topical muscle creams such as SalonPas, [employer], Bengay, etc. Please stretch, apply ice or heat (whichever helps), and have massage therapy for additional assistance.

## 2020-11-07 ENCOUNTER — Other Ambulatory Visit: Payer: Self-pay

## 2020-11-07 ENCOUNTER — Ambulatory Visit (INDEPENDENT_AMBULATORY_CARE_PROVIDER_SITE_OTHER)
Admission: RE | Admit: 2020-11-07 | Discharge: 2020-11-07 | Disposition: A | Discharge status: Home / Self Care | Payer: Self-pay | Source: Ambulatory Visit | Attending: Cardiology | Admitting: Cardiology

## 2020-11-07 DIAGNOSIS — Z8249 Family history of ischemic heart disease and other diseases of the circulatory system: Secondary | ICD-10-CM

## 2020-11-13 ENCOUNTER — Encounter: Payer: Self-pay | Admitting: *Deleted

## 2021-01-02 DIAGNOSIS — R439 Unspecified disturbances of smell and taste: Secondary | ICD-10-CM | POA: Insufficient documentation

## 2021-05-10 ENCOUNTER — Other Ambulatory Visit: Payer: Self-pay

## 2021-05-10 ENCOUNTER — Ambulatory Visit (AMBULATORY_SURGERY_CENTER): Payer: Commercial Managed Care - PPO | Admitting: *Deleted

## 2021-05-10 VITALS — Ht 73.0 in | Wt 246.0 lb

## 2021-05-10 DIAGNOSIS — Z1211 Encounter for screening for malignant neoplasm of colon: Secondary | ICD-10-CM

## 2021-05-10 MED ORDER — NA SULFATE-K SULFATE-MG SULF 17.5-3.13-1.6 GM/177ML PO SOLN: 1.0000 | Freq: Once | ORAL | 0 refills | Status: AC

## 2021-05-10 NOTE — Progress Notes (Signed)
PV completed over the phone. Pt verified name, DOB, address and insurance during PV today.  Pt mailed instruction packet with copy of consent form to read and not return, and instructions.  Pt encouraged to call with questions or issues.  If pt has My chart, procedure instructions sent via My Chart   No egg or soy allergy known to patient  PONV issues known to pt with past sedation with any surgeries or procedures and cold post op as well  Patient denies ever being told they had issues or difficulty with intubation  No FH of Malignant Hyperthermia Pt is not on diet pills Pt is not on  home 02  Pt is not on blood thinners  Pt denies issues with constipation  No A fib or A flutter  Pt is fully vaccinated  for Covid   NO PA's for preps discussed with pt In PV today  Discussed with pt there will be an out-of-pocket cost for prep and that varies from $0 to 70 +  dollars - pt verbalized understanding   Due to the COVID-19 pandemic we are asking patients to follow certain guidelines in PV and the [place]   Pt aware of COVID protocols and [facility] guidelines

## 2021-05-11 ENCOUNTER — Encounter: Payer: Self-pay | Admitting: Internal Medicine

## 2021-05-25 ENCOUNTER — Other Ambulatory Visit: Payer: Self-pay

## 2021-05-25 ENCOUNTER — Ambulatory Visit (AMBULATORY_SURGERY_CENTER): Payer: Commercial Managed Care - PPO | Admitting: Internal Medicine

## 2021-05-25 ENCOUNTER — Encounter: Payer: Self-pay | Admitting: Internal Medicine

## 2021-05-25 VITALS — BP 120/81 | HR 70 | Temp 97.2°F | Resp 11 | Ht 73.0 in | Wt 242.0 lb

## 2021-05-25 DIAGNOSIS — Z1211 Encounter for screening for malignant neoplasm of colon: Secondary | ICD-10-CM

## 2021-05-25 DIAGNOSIS — D124 Benign neoplasm of descending colon: Secondary | ICD-10-CM

## 2021-05-25 DIAGNOSIS — D122 Benign neoplasm of ascending colon: Secondary | ICD-10-CM

## 2021-05-25 DIAGNOSIS — K635 Polyp of colon: Secondary | ICD-10-CM | POA: Diagnosis not present

## 2021-05-25 DIAGNOSIS — D123 Benign neoplasm of transverse colon: Secondary | ICD-10-CM

## 2021-05-25 MED ORDER — SODIUM CHLORIDE 0.9 % IV SOLN: 500.0000 mL | Freq: Once | INTRAVENOUS | Status: AC

## 2021-05-25 NOTE — Progress Notes (Signed)
GASTROENTEROLOGY PROCEDURE H&P NOTE   Primary Care Physician: Vernie Shanks, MD    Reason for Procedure:  Colon cancer screening  Plan:    Colonoscopy  Patient is appropriate for endoscopic procedure(s) in the ambulatory ([place]) setting.  The nature of the procedure, as well as the risks, benefits, and alternatives were carefully and thoroughly reviewed with the patient. Ample time for discussion and questions allowed. The patient understood, was satisfied, and agreed to proceed.     HPI: Jamie Holland is a 50 y.o. male who presents for screening colonoscopy.  Medical history as below.  First colonoscopy.  Tolerated the prep.  No recent chest pain or shortness of breath.  No abdominal pain today.  Past Medical History:  Diagnosis Date   Arthritis    ? in thumbs   Combined hyperlipidemia    Erectile dysfunction    UNSPECIFIED   Fatigue    Hypercholesterolemia    Hyperglycemia    Low back pain    Low testosterone    Lump of skin of back    Maxillary sinusitis    Obesity    UNSPECIFIED   Perennial allergic rhinitis    PONV (postoperative nausea and vomiting)     Past Surgical History:  Procedure Laterality Date   KNEE ARTHROSCOPY Left    x2   NASAL SINUS SURGERY Left 07/03/2015   Procedure: LEFT MAXILLLARY SINUS ENDOSCOPIC SINUS SURGERY;  Surgeon: Izora Gala, MD;  Location: [facility];  Service: ENT;  Laterality: Left;    Prior to Admission medications   Medication Sig Start Date End Date Taking? Authorizing Provider  cholecalciferol (VITAMIN D3) 25 MCG (1000 UNIT) tablet Take 1,000 Units by mouth. Every other day    [provider]  Cyanocobalamin (VITAMIN B 12 PO)     [provider]  Multiple Vitamin (MULTIVITAMIN ADULT) TABS 1 tablet    [provider]  Multiple Vitamins-Minerals (HAIR SKIN AND NAILS FORMULA PO) Take by mouth daily.    [provider]  Testosterone 20.25 MG/ACT (1.62%) GEL 1 pump to skin in  the morning to shoulder, upper arms or abdomen 05/10/21   [provider]  vitamin E 180 MG (400 UNITS) capsule Take 400 Units by mouth. Every other day    [provider]    Current Outpatient Medications  Medication Sig Dispense Refill   cholecalciferol (VITAMIN D3) 25 MCG (1000 UNIT) tablet Take 1,000 Units by mouth. Every other day     Cyanocobalamin (VITAMIN B 12 PO)      Multiple Vitamin (MULTIVITAMIN ADULT) TABS 1 tablet     Multiple Vitamins-Minerals (HAIR SKIN AND NAILS FORMULA PO) Take by mouth daily.     Testosterone 20.25 MG/ACT (1.62%) GEL 1 pump to skin in the morning to shoulder, upper arms or abdomen     vitamin E 180 MG (400 UNITS) capsule Take 400 Units by mouth. Every other day     Current Facility-Administered Medications  Medication Dose Route Frequency Provider Last Rate Last Admin   0.9 %  sodium chloride infusion  500 mL Intravenous Once Fread Kottke, Lajuan Lines, MD        Allergies as of 05/25/2021   (No Known Allergies)    Family History  Problem Relation Age of Onset   Breast cancer Mother    Allergies Mother    Cancer Mother    Migraines Mother    Heart disease Father    Hypertension Father    Colon  cancer Neg Hx    Colon polyps Neg Hx    Esophageal cancer Neg Hx    Rectal cancer Neg Hx    Stomach cancer Neg Hx     Social History   Socioeconomic History   Marital status: Married    Spouse name: Not on file   Number of children: Not on file   Years of education: Not on file   Highest education level: Not on file  Occupational History   Not on file  Tobacco Use   Smoking status: Former   Smokeless tobacco: Former  Scientific laboratory technician Use: Never used  Substance and Sexual Activity   Alcohol use: Yes    Comment: social   Drug use: No   Sexual activity: Not on file  Other Topics Concern   Not on file  Social History Narrative   Not on file   Social Determinants of Health   Financial Resource Strain: Not on file  Food  Insecurity: Not on file  Transportation Needs: Not on file  Physical Activity: Not on file  Stress: Not on file  Social Connections: Not on file  Intimate Partner Violence: Not on file    Physical Exam: Vital signs in last 24 hours: @BP  (!) 147/84   Pulse 70   Temp (!) 97.2 F (36.2 C)   Ht 6\' 1"  (1.854 m)   Wt 242 lb (109.8 kg)   SpO2 98%   BMI 31.93 kg/m  GEN: NAD EYE: Sclerae anicteric ENT: MMM CV: Non-tachycardic Pulm: CTA b/l GI: Soft, NT/ND NEURO:  Alert & Oriented x 3   Zenovia Jarred, MD [facility]  05/25/2021 9:06 AM

## 2021-05-25 NOTE — Progress Notes (Signed)
Transported to PACU, VSS 

## 2021-05-25 NOTE — Op Note (Signed)
[facility] Patient Name: Jamie Holland Procedure Date: 05/25/2021 9:14 AM MRN: [id number] Endoscopist: Jerene Bears , MD Age: 50 Referring MD:  Date of Birth: 03-Oct-1970 Gender: Male Account #: [id number] Procedure:                Colonoscopy Indications:              Screening for colorectal malignant neoplasm, This                            is the patient's first colonoscopy Medicines:                Monitored Anesthesia Care Procedure:                Pre-Anesthesia Assessment:                           - Prior to the procedure, a History and Physical                            was performed, and patient medications and                            allergies were reviewed. The patient's tolerance of                            previous anesthesia was also reviewed. The risks                            and benefits of the procedure and the sedation                            options and risks were discussed with the patient.                            All questions were answered, and informed consent                            was obtained. Prior Anticoagulants: The patient has                            taken no previous anticoagulant or antiplatelet                            agents. ASA Grade Assessment: II - A patient with                            mild systemic disease. After reviewing the risks                            and benefits, the patient was deemed in                            satisfactory condition to undergo the procedure.  After obtaining informed consent, the colonoscope                            was passed under direct vision. Throughout the                            procedure, the patient's blood pressure, pulse, and                            oxygen saturations were monitored continuously. The                            Olympus CF-HQ190L [id number]) Colonoscope was                            introduced through the anus and  advanced to the                            cecum, identified by appendiceal orifice and                            ileocecal valve. The colonoscopy was performed                            without difficulty. The patient tolerated the                            procedure well. The quality of the bowel                            preparation was excellent. The ileocecal valve,                            appendiceal orifice, and rectum were photographed. Scope In: 9:19:57 AM Scope Out: 9:33:55 AM Scope Withdrawal Time: 0 hours 12 minutes 26 seconds  Total Procedure Duration: 0 hours 13 minutes 58 seconds  Findings:                 The digital rectal exam was normal.                           Two sessile polyps were found in the ascending                            colon. The polyps were 2 to 6 mm in size. These                            polyps were removed with a cold snare. Resection                            and retrieval were complete.                           A 6 mm polyp was found in the transverse colon. The  polyp was sessile. The polyp was removed with a                            cold snare. Resection and retrieval were complete.                           Two sessile polyps were found in the descending                            colon. The polyps were 4 to 9 mm in size. These                            polyps were removed with a cold snare. Resection                            and retrieval were complete.                           Scattered small-mouthed diverticula were found in                            the sigmoid colon and descending colon.                           Internal hemorrhoids were found during                            retroflexion. The hemorrhoids were small. Complications:            No immediate complications. Estimated Blood Loss:     Estimated blood loss was minimal. Impression:               - Two 2 to 6 mm polyps in the ascending  colon,                            removed with a cold snare. Resected and retrieved.                           - One 6 mm polyp in the transverse colon, removed                            with a cold snare. Resected and retrieved.                           - Two 4 to 9 mm polyps in the descending colon,                            removed with a cold snare. Resected and retrieved.                           - Diverticulosis in the sigmoid colon and in the  descending colon.                           - Small internal hemorrhoids. Recommendation:           - Patient has a contact number available for                            emergencies. The signs and symptoms of potential                            delayed complications were discussed with the                            patient. Return to normal activities tomorrow.                            Written discharge instructions were provided to the                            patient.                           - Resume previous diet.                           - Continue present medications.                           - Await pathology results.                           - Repeat colonoscopy is recommended for                            surveillance. The colonoscopy date will be                            determined after pathology results from today's                            exam become available for review. Jerene Bears, MD 05/25/2021 9:42:23 AM This report has been signed electronically.

## 2021-05-25 NOTE — Progress Notes (Signed)
Pt's states no medical or surgical changes since previsit or office visit. 

## 2021-05-25 NOTE — Progress Notes (Signed)
C.W. vital signs. 

## 2021-05-25 NOTE — Progress Notes (Signed)
Called to room to assist during endoscopic procedure.  Patient ID and intended procedure confirmed with present staff. Received instructions for my participation in the procedure from the performing physician.  

## 2021-05-25 NOTE — Patient Instructions (Signed)
YOU HAD AN ENDOSCOPIC PROCEDURE TODAY AT THE  ENDOSCOPY CENTER:   Refer to the procedure report that was given to you for any specific questions about what was found during the examination.  If the procedure report does not answer your questions, please call your gastroenterologist to clarify.  If you requested that your care partner not be given the details of your procedure findings, then the procedure report has been included in a sealed envelope for you to review at your convenience later.  YOU SHOULD EXPECT: Some feelings of bloating in the abdomen. Passage of more gas than usual.  Walking can help get rid of the air that was put into your GI tract during the procedure and reduce the bloating. If you had a lower endoscopy (such as a colonoscopy or flexible sigmoidoscopy) you may notice spotting of blood in your stool or on the toilet paper. If you underwent a bowel prep for your procedure, you may not have a normal bowel movement for a few days.  Please Note:  You might notice some irritation and congestion in your nose or some drainage.  This is from the oxygen used during your procedure.  There is no need for concern and it should clear up in a day or so.  SYMPTOMS TO REPORT IMMEDIATELY:  Following lower endoscopy (colonoscopy or flexible sigmoidoscopy):  Excessive amounts of blood in the stool  Significant tenderness or worsening of abdominal pains  Swelling of the abdomen that is new, acute  Fever of 100F or higher   For urgent or emergent issues, a gastroenterologist can be reached at any hour by calling [phone]. Do not use MyChart messaging for urgent concerns.    DIET:  We do recommend a small meal at first, but then you may proceed to your regular diet.  Drink plenty of fluids but you should avoid alcoholic beverages for 24 hours.  MEDICATIONS:  Continue present medications.  Please see handouts given to you by your recovery nurse.  Thank you for allowing us to  provide for your healthcare needs today.  ACTIVITY:  You should plan to take it easy for the rest of today and you should NOT DRIVE or use heavy machinery until tomorrow (because of the sedation medicines used during the test).    FOLLOW UP: Our staff will call the number listed on your records 48-72 hours following your procedure to check on you and address any questions or concerns that you may have regarding the information given to you following your procedure. If we do not reach you, we will leave a message.  We will attempt to reach you two times.  During this call, we will ask if you have developed any symptoms of COVID 19. If you develop any symptoms (ie: fever, flu-like symptoms, shortness of breath, cough etc.) before then, please call [phone].  If you test positive for Covid 19 in the 2 weeks post procedure, please call and report this information to us.    If any biopsies were taken you will be contacted by phone or by letter within the next 1-3 weeks.  Please call us at [phone] if you have not heard about the biopsies in 3 weeks.    SIGNATURES/CONFIDENTIALITY: You and/or your care partner have signed paperwork which will be entered into your electronic medical record.  These signatures attest to the fact that that the information above on your After Visit Summary has been reviewed and is understood.  Full responsibility of the   confidentiality of this discharge information lies with you and/or your care-partner.  

## 2021-05-28 ENCOUNTER — Encounter: Payer: Self-pay | Admitting: Internal Medicine

## 2021-05-29 ENCOUNTER — Telehealth: Payer: Self-pay

## 2021-05-29 ENCOUNTER — Encounter: Payer: Self-pay | Admitting: Internal Medicine

## 2021-05-29 NOTE — Telephone Encounter (Signed)
  Follow up Call-  Call back number 05/25/2021  Post procedure Call Back phone  # [phone]  Permission to leave phone message Yes  Some recent data might be hidden     Patient questions:  Do you have a fever, pain , or abdominal swelling? No. Pain Score  0 *  Have you tolerated food without any problems? Yes.    Have you been able to return to your normal activities? Yes.    Do you have any questions about your discharge instructions: Diet   No. Medications  No. Follow up visit  No.  Do you have questions or concerns about your Care? No.  Actions: * If pain score is 4 or above: No action needed, pain <4.  Have you developed a fever since your procedure? no  2.   Have you had an respiratory symptoms (SOB or cough) since your procedure? no  3.   Have you tested positive for COVID 19 since your procedure no  4.   Have you had any family members/close contacts diagnosed with the COVID 19 since your procedure?  no   If yes to any of these questions please route to Joylene John, RN and Joella Prince, RN

## 2021-06-20 ENCOUNTER — Other Ambulatory Visit: Payer: Self-pay

## 2021-06-20 ENCOUNTER — Encounter: Payer: Self-pay | Admitting: Neurology

## 2021-06-20 ENCOUNTER — Ambulatory Visit: Payer: Commercial Managed Care - PPO | Admitting: Neurology

## 2021-06-20 VITALS — BP 126/75 | HR 99 | Ht 73.0 in | Wt 246.0 lb

## 2021-06-20 DIAGNOSIS — R7989 Other specified abnormal findings of blood chemistry: Secondary | ICD-10-CM | POA: Insufficient documentation

## 2021-06-20 DIAGNOSIS — R739 Hyperglycemia, unspecified: Secondary | ICD-10-CM | POA: Insufficient documentation

## 2021-06-20 DIAGNOSIS — R413 Other amnesia: Secondary | ICD-10-CM | POA: Insufficient documentation

## 2021-06-20 DIAGNOSIS — J3089 Other allergic rhinitis: Secondary | ICD-10-CM | POA: Insufficient documentation

## 2021-06-20 DIAGNOSIS — R430 Anosmia: Secondary | ICD-10-CM | POA: Diagnosis not present

## 2021-06-20 DIAGNOSIS — F8081 Childhood onset fluency disorder: Secondary | ICD-10-CM | POA: Insufficient documentation

## 2021-06-20 DIAGNOSIS — E611 Iron deficiency: Secondary | ICD-10-CM | POA: Insufficient documentation

## 2021-06-20 DIAGNOSIS — R635 Abnormal weight gain: Secondary | ICD-10-CM | POA: Insufficient documentation

## 2021-06-20 DIAGNOSIS — R222 Localized swelling, mass and lump, trunk: Secondary | ICD-10-CM | POA: Insufficient documentation

## 2021-06-20 DIAGNOSIS — E782 Mixed hyperlipidemia: Secondary | ICD-10-CM | POA: Insufficient documentation

## 2021-06-20 DIAGNOSIS — J320 Chronic maxillary sinusitis: Secondary | ICD-10-CM | POA: Insufficient documentation

## 2021-06-20 DIAGNOSIS — Z8249 Family history of ischemic heart disease and other diseases of the circulatory system: Secondary | ICD-10-CM | POA: Insufficient documentation

## 2021-06-20 DIAGNOSIS — N529 Male erectile dysfunction, unspecified: Secondary | ICD-10-CM | POA: Insufficient documentation

## 2021-06-20 DIAGNOSIS — G479 Sleep disorder, unspecified: Secondary | ICD-10-CM | POA: Insufficient documentation

## 2021-06-20 DIAGNOSIS — R432 Parageusia: Secondary | ICD-10-CM | POA: Diagnosis not present

## 2021-06-20 DIAGNOSIS — G3184 Mild cognitive impairment, so stated: Secondary | ICD-10-CM | POA: Diagnosis not present

## 2021-06-20 DIAGNOSIS — R5383 Other fatigue: Secondary | ICD-10-CM | POA: Insufficient documentation

## 2021-06-20 NOTE — Patient Instructions (Addendum)
MRI Brain without contrast, preferably open MRI  Follow up with your PCP and return if worse     There are well-accepted and sensible ways to reduce risk for Alzheimers disease and other degenerative brain disorders .  Exercise Daily Walk A daily 20 minute walk should be part of your routine. Disease related apathy can be a significant roadblock to exercise and the only way to overcome this is to make it a daily routine and perhaps have a reward at the end (something your loved one loves to eat or drink perhaps) or a personal trainer coming to the home can also be very useful. Most importantly, the patient is much more likely to exercise if the caregiver / spouse does it with him/her. In general a structured, repetitive schedule is best.  General Health: Any diseases which effect your body will effect your brain such as a pneumonia, urinary infection, blood clot, heart attack or stroke. Keep contact with your primary care doctor for regular follow ups.  Sleep. A good nights sleep is healthy for the brain. Seven hours is recommended. If you have insomnia or poor sleep habits we can give you some instructions. If you have sleep apnea wear your mask.  Diet: Eating a heart healthy diet is also a good idea; fish and poultry instead of red meat, nuts (mostly non-peanuts), vegetables, fruits, olive oil or canola oil (instead of butter), minimal salt (use other spices to flavor foods), whole grain rice, bread, cereal and pasta and wine in moderation.Research is now showing that the MIND diet, which is a combination of The Mediterranean diet and the DASH diet, is beneficial for cognitive processing and longevity. Information about this diet can be found in The MIND Diet, a book by Doyne Keel, MS, RDN, and online at [employer]bookDist[employer].si  Finances, Power of Attorney and Advance Directives: You should consider putting legal safeguards in place with regard to financial and medical  decision making. While the spouse always has power of attorney for medical and financial issues in the absence of any form, you should consider what you want in case the spouse / caregiver is no longer around or capable of making decisions.     Heart-head connection  New research shows there are things we can do to reduce the risk of mild cognitive impairment and dementia.  Several conditions known to increase the risk of cardiovascular disease -- such as high blood pressure, diabetes and high cholesterol -- also increase the risk of developing Alzheimer's. Some autopsy studies show that as many as 15 percent of individuals with Alzheimer's disease also have cardiovascular disease.  A longstanding question is why some people develop hallmark Alzheimer's plaques and tangles but do not develop the symptoms of Alzheimer's. Vascular disease may help researchers eventually find an answer. Some autopsy studies suggest that plaques and tangles may be present in the brain without causing symptoms of cognitive decline unless the brain also shows evidence of vascular disease. More research is needed to better understand the link between vascular health and Alzheimer's.  Physical exercise and diet Regular physical exercise may be a beneficial strategy to lower the risk of Alzheimer's and vascular dementia. Exercise may directly benefit brain cells by increasing blood and oxygen flow in the brain. Because of its known cardiovascular benefits, a medically approved exercise program is a valuable part of any overall wellness plan.  Current evidence suggests that heart-healthy eating may also help protect the brain. Heart-healthy eating includes limiting the intake of sugar and  saturated fats and making sure to eat plenty of fruits, vegetables, and whole grains. No one diet is best. Two diets that have been studied and may be beneficial are the DASH (Dietary Approaches to Stop Hypertension) diet and the Mediterranean  diet. The DASH diet emphasizes vegetables, fruits and fat-free or low-fat dairy products; includes whole grains, fish, poultry, beans, seeds, nuts and vegetable oils; and limits sodium, sweets, sugary beverages and red meats. A Mediterranean diet includes relatively little red meat and emphasizes whole grains, fruits and vegetables, fish and shellfish, and nuts, olive oil and other healthy fats.  Social connections and intellectual activity A number of studies indicate that maintaining strong social connections and keeping mentally active as we age might lower the risk of cognitive decline and Alzheimer's. Experts are not certain about the reason for this association. It may be due to direct mechanisms through which social and mental stimulation strengthen connections between nerve cells in the brain.  Head trauma There appears to be a strong link between future risk of Alzheimer's and serious head trauma, especially when injury involves loss of consciousness. You can help reduce your risk of Alzheimer's by protecting your head.  Wear a seat belt  Use a helmet when participating in sports  "Fall-proof" your home   What you can do now While research is not yet conclusive, certain lifestyle choices, such as physical activity and diet, may help support brain health and prevent Alzheimer's. Many of these lifestyle changes have been shown to lower the risk of other diseases, like heart disease and diabetes, which have been linked to Alzheimer's. With few drawbacks and plenty of known benefits, healthy lifestyle choices can improve your health and possibly protect your brain.  Learn more about brain health. You can help increase our knowledge by considering participation in a clinical study. Our free clinical trial matching services, TrialMatch, can help you find clinical trials in your area that are seeking volunteers.
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
## 2021-07-04 ENCOUNTER — Telehealth: Payer: Self-pay | Admitting: Neurology

## 2021-07-04 NOTE — Telephone Encounter (Signed)
Open MRI UMR no auth req spoke to [facility] Ref # [id number] order [facility]ed [facility], they will reach out to the patient to schedule

## 2021-07-05 NOTE — Telephone Encounter (Signed)
schedule for 1/19 7pm

## 2021-07-10 ENCOUNTER — Other Ambulatory Visit: Payer: Self-pay | Admitting: Neurology

## 2021-07-10 MED ORDER — ALPRAZOLAM 1 MG PO TABS: ORAL_TABLET | ORAL | 0 refills | Status: DC

## 2021-07-10 NOTE — Telephone Encounter (Signed)
Order for alprazolam 1mg , per MRI protocol, sent to Dr. April Manson for review. Left patient a message that he may check with his pharmacy for the prescription. He will need to have a driver available the day of his scan. Provided our number to call back with any questions.

## 2021-07-10 NOTE — Telephone Encounter (Signed)
Because pt is claustrophobic, he is asking if something can be called in for his upcoming MRI.  Pt is still using [id number] and there has not been any changes to his insurance with the [employer]

## 2021-07-19 ENCOUNTER — Telehealth: Payer: Self-pay | Admitting: Neurology

## 2021-07-19 NOTE — Telephone Encounter (Signed)
Patient and wife called in to see if results are in from MRI done last week 07/12/21 at [facility]. I let them know I did not see anything in chart regarding results but would have our clinical team reach out in case there is something I don't see, but we may not have gotten results yet. Best call back number to patient is [phone].

## 2021-07-23 NOTE — Telephone Encounter (Signed)
Provider aware

## 2021-07-23 NOTE — Telephone Encounter (Signed)
Pt inquiring on MRI results and when to expect a call. Pt would like a call back

## 2021-07-24 NOTE — Telephone Encounter (Signed)
Spoke with patient and spouse on 07/23/21. Went over the MRI images and results in details. All questions answered.   Dr. April Manson

## 2021-10-22 NOTE — Progress Notes (Signed)
?Cardiology Office Note:   ? ?Date:  10/23/2021  ? ?ID:  Jamie Holland, DOB 12/22/1970, MRN [id number] ? ?PCP:  Vernie Shanks, MD ?  ?[place]  ?Cardiologist:  Candee Furbish, MD  ?Advanced Practice Provider:  No care team member to display ?Electrophysiologist:  None  ?   ? ?Referring MD: Vernie Shanks, MD  ? ? ?History of Present Illness:   ? ?Jamie Holland is a 51 y.o. male here for the follow-up of hyperlipidemia, and family history of CAD. ? ?Previously here for follow-up of chest discomfort.  Has had fatigue in the past concerning for cardiac condition.  He smoked several years ago quit in 2010. ? 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 ? ?Father had a myocardial infarction at 10, grandfather died of MI after splitting wood. ? ?Today: ?He is feeling well overall aside from gaining some weight. In the summer his average weight is around 238 lbs. Currently 258 lbs in clinic today.  ? ?Since his last visit, on 11/07/20 he had a coronary calcium score of 0. ? ?For activity he continues to work as the Soil scientist at [employer]. ? ?He denies any palpitations, chest pain, shortness of breath, or peripheral edema. No lightheadedness, headaches, syncope, orthopnea, or PND. ? ? ?Past Medical History:  ?Diagnosis Date  ? Arthritis   ? ? in thumbs  ? Combined hyperlipidemia   ? Erectile dysfunction   ? UNSPECIFIED  ? Fatigue   ? Hypercholesterolemia   ? Hyperglycemia   ? Low back pain   ? Low testosterone   ? Lump of skin of back   ? Maxillary sinusitis   ? Obesity   ? UNSPECIFIED  ? Perennial allergic rhinitis   ? PONV (postoperative nausea and vomiting)   ? ? ?Past Surgical History:  ?Procedure Laterality Date  ? KNEE ARTHROSCOPY Left   ? x2  ? NASAL SINUS SURGERY Left 07/03/2015  ? Procedure: LEFT MAXILLLARY SINUS ENDOSCOPIC SINUS SURGERY;  Surgeon: Izora Gala, MD;  Location: [facility];  Service: ENT;  Laterality: Left;  ? ? ?Current  Medications: ?Current Meds  ?Medication Sig  ? cholecalciferol (VITAMIN D3) 25 MCG (1000 UNIT) tablet Take 1,000 Units by mouth. Every other day  ? Cyanocobalamin (VITAMIN B 12 PO)   ? Multiple Vitamin (MULTIVITAMIN ADULT) TABS 1 tablet  ? Multiple Vitamins-Minerals (HAIR SKIN AND NAILS FORMULA PO) Take by mouth daily.  ? vitamin E 180 MG (400 UNITS) capsule Take 400 Units by mouth. Every other day  ? ?Current Facility-Administered Medications for the 10/23/21 encounter (Office Visit) with Jerline Pain, MD  ?Medication  ? 0.9 %  sodium chloride infusion  ?  ? ?Allergies:   Patient has no known allergies.  ? ?Social History  ? ?Socioeconomic History  ? Marital status: Married  ?  Spouse name: Not on file  ? Number of children: Not on file  ? Years of education: Not on file  ? Highest education level: Not on file  ?Occupational History  ? Not on file  ?Tobacco Use  ? Smoking status: Former  ? Smokeless tobacco: Former  ?Vaping Use  ? Vaping Use: Never used  ?Substance and Sexual Activity  ? Alcohol use: Yes  ?  Comment: social  ? Drug use: No  ? Sexual activity: Not on file  ?Other Topics Concern  ? Not on file  ?Social History Narrative  ? Not on file  ? ?  Social Determinants of Health  ? ?Financial Resource Strain: Not on file  ?Food Insecurity: Not on file  ?Transportation Needs: Not on file  ?Physical Activity: Not on file  ?Stress: Not on file  ?Social Connections: Not on file  ?  ? ?Family History: ?The patient's family history includes Allergies in his mother; Breast cancer in his mother; Cancer in his mother; Heart disease in his father; Hypertension in his father; Migraines in his mother. There is no history of Colon cancer, Colon polyps, Esophageal cancer, Rectal cancer, or Stomach cancer. ? ?ROS:   ?Please see the history of present illness. ?All other systems are reviewed and negative.  ? ? ?EKGs/Labs/Other Studies Reviewed:   ? ?Coronary Calcium Score 11/07/2020: ?FINDINGS: ?Coronary arteries: Normal  origins. ?  ?Coronary Calcium Score: ?  ?Left main: 0 ?  ?Left anterior descending artery: 0 ?  ?Left circumflex artery: 0 ?  ?Right coronary artery: 0 ?  ?Total: 0 ?  ?Percentile: 0 ?  ?Pericardium: Normal. ?  ?Ascending Aorta: Normal caliber. ?  ?Non-cardiac: See separate report from [facility]. ?  ?IMPRESSION: ?Coronary calcium score of 0. This was 0 percentile for age-, race-, ?and sex-matched controls. ? ?Exercise Tolerance Test 07/04/2020: ?Blood pressure demonstrated a normal response to exercise. ?There was no ST segment deviation noted during stress. ?  ?Negative adequate stress test. ? ? ?EKG:  EKG is personally reviewed. ?10/23/2021: Sinus rhythm. Rate 89 bpm. Nonspecific ST/T wave changes. ?09/28/2020: sinus rhythm 76 no other abnormalities. ? ?Recent Labs: ?No results found for requested labs within last 8760 hours.  ? ?Recent Lipid Panel ?No results found for: CHOL, TRIG, HDL, CHOLHDL, VLDL, LDLCALC, LDLDIRECT ? ? ?Risk Assessment/Calculations:   ? ? ? ?Physical Exam:   ? ?VS:  BP 132/84   Pulse 89   Ht '6\' 1"'$  (1.854 m)   Wt 258 lb (117 kg)   SpO2 95%   BMI 34.04 kg/m?    ? ?Wt Readings from Last 3 Encounters:  ?10/23/21 258 lb (117 kg)  ?06/20/21 246 lb (111.6 kg)  ?05/25/21 242 lb (109.8 kg)  ?  ? ?GEN:  Well nourished, well developed in no acute distress ?HEENT: Normal ?NECK: No JVD; No carotid bruits ?LYMPHATICS: No lymphadenopathy ?CARDIAC: RRR, no murmurs, rubs, gallops ?RESPIRATORY:  Clear to auscultation without rales, wheezing or rhonchi  ?ABDOMEN: Soft, non-tender, non-distended ?MUSCULOSKELETAL:  No edema; No deformity  ?SKIN: Warm and dry ?NEUROLOGIC:  Alert and oriented x 3 ?PSYCHIATRIC:  Normal affect  ? ?ASSESSMENT:   ? ?1. Family history of coronary artery disease   ?2. SOB (shortness of breath)   ?3. Pure hypercholesterolemia   ?4. Family history of ischemic heart disease (IHD)   ?5. Mixed hyperlipidemia   ?6. Weight gain   ?7. Former smoker   ? ? ?PLAN:   ? ?Family history of  ischemic heart disease (IHD) ?Father and grandfather had coronary artery disease.  Thankfully coronary calcium score was 0 in 2022. ? ?Mixed hyperlipidemia ?LDL 122, triglycerides 253.  Continue with diet, exercise.  Decrease carbohydrates.  Calcium score was 0. ? ?Weight gain ?Continue to encourage weight loss. ? ?Former smoker ?Quit back in 2010.  Excellent. ? ? ?Follow-up:  1 year. ? ?Medication Adjustments/Labs and Tests Ordered: ?Current medicines are reviewed at length with the patient today.  Concerns regarding medicines are outlined above.  ? ?Orders Placed This Encounter  ?Procedures  ? EKG 12-Lead  ? ?No orders of the defined types were placed in this  encounter. ? ?Patient Instructions  ?Medication Instructions:  ?The current medical regimen is effective;  continue present plan and medications. ? ?*If you need a refill on your cardiac medications before your next appointment, please call your pharmacy* ? ?Follow-Up: ?At [facility], you and your health needs are our priority.  As part of our continuing mission to provide you with exceptional heart care, we have created designated Provider Care Teams.  These Care Teams include your primary Cardiologist (physician) and Advanced Practice Providers (APPs -  Physician Assistants and Nurse Practitioners) who all work together to provide you with the care you need, when you need it. ? ?We recommend signing up for the patient portal called "MyChart".  Sign up information is provided on this After Visit Summary.  MyChart is used to connect with patients for Virtual Visits (Telemedicine).  Patients are able to view lab/test results, encounter notes, upcoming appointments, etc.  Non-urgent messages can be sent to your provider as well.   ?To learn more about what you can do with MyChart, go to [email or web address].   ? ?Your next appointment:   ?1 year(s) ? ?The format for your next appointment:   ?In Person ? ?Provider:   ?Candee Furbish, MD { ? ? ?Important  Information About Sugar ? ? ? ? ?   ? ?I,Mathew Stumpf,acting as a scribe for [employer], MD.,have documented all relevant documentation on the behalf of Candee Furbish, MD,as directed by  Candee Furbish, MD while in the presence

## 2021-10-23 ENCOUNTER — Encounter: Payer: Self-pay | Admitting: Cardiology

## 2021-10-23 ENCOUNTER — Ambulatory Visit: Payer: Commercial Managed Care - PPO | Admitting: Cardiology

## 2021-10-23 VITALS — BP 132/84 | HR 89 | Ht 73.0 in | Wt 258.0 lb

## 2021-10-23 DIAGNOSIS — E782 Mixed hyperlipidemia: Secondary | ICD-10-CM

## 2021-10-23 DIAGNOSIS — R0602 Shortness of breath: Secondary | ICD-10-CM | POA: Diagnosis not present

## 2021-10-23 DIAGNOSIS — R635 Abnormal weight gain: Secondary | ICD-10-CM

## 2021-10-23 DIAGNOSIS — E7800 Pure hypercholesterolemia, unspecified: Secondary | ICD-10-CM

## 2021-10-23 DIAGNOSIS — Z8249 Family history of ischemic heart disease and other diseases of the circulatory system: Secondary | ICD-10-CM

## 2021-10-23 DIAGNOSIS — Z87891 Personal history of nicotine dependence: Secondary | ICD-10-CM

## 2021-10-23 NOTE — Assessment & Plan Note (Signed)
Continue to encourage weight loss. 

## 2021-10-23 NOTE — Assessment & Plan Note (Signed)
Quit back in 2010.  Excellent. ?

## 2021-10-23 NOTE — Assessment & Plan Note (Signed)
Father and grandfather had coronary artery disease.  Thankfully coronary calcium score was 0 in 2022. ?

## 2021-10-23 NOTE — Patient Instructions (Addendum)

## 2021-10-23 NOTE — Assessment & Plan Note (Signed)
LDL 122, triglycerides 253.  Continue with diet, exercise.  Decrease carbohydrates.  Calcium score was 0. ?

## 2022-02-14 ENCOUNTER — Encounter: Payer: Self-pay | Admitting: Primary Care

## 2022-02-14 ENCOUNTER — Ambulatory Visit (INDEPENDENT_AMBULATORY_CARE_PROVIDER_SITE_OTHER): Payer: Commercial Managed Care - PPO | Admitting: Primary Care

## 2022-02-14 VITALS — BP 126/68 | HR 75 | Temp 98.8°F | Ht 73.0 in | Wt 261.2 lb

## 2022-02-14 DIAGNOSIS — R0683 Snoring: Secondary | ICD-10-CM | POA: Diagnosis not present

## 2022-02-14 NOTE — Progress Notes (Signed)
$'@Patient'l$  ID: Jamie Holland, male    DOB: 06-May-1971, 51 y.o.   MRN: [id number]  Chief Complaint  Patient presents with   Consult    Sleep Consult-Snoring    Referring provider: Izora Gala, MD  HPI: 51 year old male.  Former smoker.  Medical history significant for chronic maxillary sinusitis, TMJ dysfunction, iron deficiency, mixed hyperlipidemia.  02/14/2022 Patient presents today for sleep consult. He has symptoms of snoring which wakes his wife up at night. Symptoms primarily occur when sleeping on his back. His snoring does not bother him. He has been tried on oral appliance in the past but did not particularly help. Weight is up 25lbs in the last year. He has an adjustable bed frame. No daytime sleepiness.  Denies symptoms of narcolepsy, cataplexy or sleepwalking.  Sleep questionnaire Symptoms-snoring Prior sleep study-had a home sleep study 4 to 5 years ago  Bedtime- 10-11PM Time to fall asleep-less than 15 minutes Nocturnal awakenings- 4-5 TIMES Out of bed/start of day-5:15 AM Weight changes-increase 25 pounds Do you operate heavy machinery-occasionally Do you currently wear CPAP-no Do you current wear oxygen-no Epworth- 3  No Known Allergies  Immunization History  Administered Date(s) Administered   Influenza-Unspecified 04/24/2018   Tdap 04/06/2018    Past Medical History:  Diagnosis Date   Arthritis    ? in thumbs   Combined hyperlipidemia    Erectile dysfunction    UNSPECIFIED   Fatigue    Hypercholesterolemia    Hyperglycemia    Low back pain    Low testosterone    Lump of skin of back    Maxillary sinusitis    Obesity    UNSPECIFIED   Perennial allergic rhinitis    PONV (postoperative nausea and vomiting)     Tobacco History: Social History   Tobacco Use  Smoking Status Former   Types: Cigarettes   Quit date: 06/23/2008   Years since quitting: 13.6  Smokeless Tobacco Former   Counseling given: Not Answered   Outpatient Medications  Prior to Visit  Medication Sig Dispense Refill   Apoaequorin (PREVAGEN PO) Take by mouth. 1 capsule daily     cholecalciferol (VITAMIN D3) 25 MCG (1000 UNIT) tablet Take 1,000 Units by mouth. Every other day     Cyanocobalamin (VITAMIN B 12 PO) daily     Multiple Vitamin (MULTIVITAMIN ADULT) TABS 1 tablet     Multiple Vitamins-Minerals (HAIR SKIN AND NAILS FORMULA PO) Take by mouth daily.     vitamin E 180 MG (400 UNITS) capsule Take 400 Units by mouth. Every other day     Facility-Administered Medications Prior to Visit  Medication Dose Route Frequency Provider Last Rate Last Admin   0.9 %  sodium chloride infusion  500 mL Intravenous Once Pyrtle, Lajuan Lines, MD       Review of Systems  Review of Systems  Constitutional: Negative.   HENT: Negative.    Respiratory:  Positive for apnea.   Psychiatric/Behavioral:  Negative for sleep disturbance.     Physical Exam  BP 126/68 (BP Location: Right Arm, Cuff Size: Normal)   Pulse 75   Temp 98.8 F (37.1 C) (Temporal)   Ht '6\' 1"'$  (1.854 m)   Wt 261 lb 3.2 oz (118.5 kg)   SpO2 96%   BMI 34.46 kg/m  Physical Exam Constitutional:      Appearance: Normal appearance.  HENT:     Head: Normocephalic and atraumatic.  Cardiovascular:     Rate and Rhythm: Normal rate and regular  rhythm.  Pulmonary:     Effort: Pulmonary effort is normal.     Breath sounds: Normal breath sounds.  Skin:    General: Skin is warm and dry.  Neurological:     General: No focal deficit present.     Mental Status: He is alert and oriented to person, place, and time. Mental status is at baseline.  Psychiatric:        Mood and Affect: Mood normal.        Behavior: Behavior normal.        Thought Content: Thought content normal.        Judgment: Judgment normal.      Lab Results:  CBC    Component Value Date/Time   WBC 6.5 11/25/2017 0747   RBC 4.91 11/25/2017 0747   HGB 15.1 11/25/2017 0747   HCT 44.4 11/25/2017 0747   PLT 181 11/25/2017 0747   MCV  90.4 11/25/2017 0747   MCH 30.8 11/25/2017 0747   MCHC 34.0 11/25/2017 0747   RDW 12.5 11/25/2017 0747    BMET    Component Value Date/Time   NA 140 11/25/2017 0747   K 4.5 11/25/2017 0747   CL 108 11/25/2017 0747   CO2 22 11/25/2017 0747   GLUCOSE 162 (H) 11/25/2017 0747   BUN 16 11/25/2017 0747   CREATININE 1.24 11/25/2017 0747   CALCIUM 9.2 11/25/2017 0747   GFRNONAA >60 11/25/2017 0747   GFRAA >60 11/25/2017 0747    BNP No results found for: "BNP"  ProBNP No results found for: "PROBNP"  Imaging: No results found.   Assessment & Plan:   Loud snoring - Patient has symptoms of loud snoring, primarily when sleeping on his back.  Epworth score is 3.  BMI 34.  Concern patient could have obstructive sleep apnea, needs in lab sleep study to evaluate as previous home sleep study was negative.  Reviewed risks of untreated sleep apnea including cardiac arrhythmias, pulmonary hypertension, stroke and diabetes. We also reviewed treatment options include weight loss, oral appliance, CPAP therapy or referral to ENT for possible surgical options.  Advised patient to focus on side sleeping position or elevate head of bed 30 degrees with wedge pillow.  Advised against driving experiencing excessive daytime sleepiness fatigue.  Encourage weight loss efforts.  Follow-up 1 to 2 weeks after sleep study to review results and treatment options if needed.   Martyn Ehrich, NP 02/14/2022

## 2022-02-14 NOTE — Assessment & Plan Note (Addendum)
-   Patient has symptoms of loud snoring, primarily when sleeping on his back.  Epworth score is 3.  BMI 34.  Concern patient could have obstructive sleep apnea, needs in lab sleep study to evaluate as previous home sleep study was negative.  Reviewed risks of untreated sleep apnea including cardiac arrhythmias, pulmonary hypertension, stroke and diabetes. We also reviewed treatment options include weight loss, oral appliance, CPAP therapy or referral to ENT for possible surgical options.  Advised patient to focus on side sleeping position or elevate head of bed 30 degrees with wedge pillow.  Advised against driving experiencing excessive daytime sleepiness fatigue.  Encourage weight loss efforts.  Follow-up 1 to 2 weeks after sleep study to review results and treatment options if needed.

## 2022-02-14 NOTE — Patient Instructions (Addendum)
Sleep apnea is defined as period of 10 seconds or longer when you stop breathing at night. This can happen multiple times a night. Dx sleep apnea is when this occurs more than 5 times an hour.    Mild OSA 5-15 apneic events an hour Moderate OSA 15-30 apneic events an hour Severe OSA > 30 apneic events an hour   Untreated sleep apnea puts you at higher risk for cardiac arrhythmias, pulmonary HTN, stroke and diabetes   Treatment options include weight loss, side sleeping position, oral appliance, CPAP therapy or referral to ENT for possible surgical options    Recommendations: Focus on side sleeping position or elevate head of bed 30 degrees/wedge pillow  Work on weight loss efforts if needed  Do not drive if experiencing excessive daytime sleepiness of fatigue   Orders: Polysomnography re: loud snoring   Follow-up: Please call 1-2 weeks after sleep study to set up follow-up to review sleep study results and treatment if needed

## 2022-02-16 NOTE — Progress Notes (Signed)
Reviewed and agree with assessment/plan.   Chesley Mires, MD [facility] Pulmonary/Critical Care 02/16/2022, 2:28 PM Pager:  [phone]

## 2022-02-28 ENCOUNTER — Telehealth: Payer: Self-pay | Admitting: Primary Care

## 2022-02-28 NOTE — Telephone Encounter (Signed)
Patient states that he was suppose to be called to schedule lab sleep study but has not heard anything.   Please advise, call back [phone].

## 2022-03-04 NOTE — Telephone Encounter (Signed)
Patient is scheduled for 03/21/2022

## 2022-03-21 ENCOUNTER — Ambulatory Visit (HOSPITAL_BASED_OUTPATIENT_CLINIC_OR_DEPARTMENT_OTHER): Payer: Commercial Managed Care - PPO | Attending: Primary Care | Admitting: Pulmonary Disease

## 2022-03-21 DIAGNOSIS — G479 Sleep disorder, unspecified: Secondary | ICD-10-CM

## 2022-03-21 DIAGNOSIS — R0683 Snoring: Secondary | ICD-10-CM | POA: Insufficient documentation

## 2022-03-27 DIAGNOSIS — R0683 Snoring: Secondary | ICD-10-CM | POA: Diagnosis not present

## 2022-03-27 DIAGNOSIS — G479 Sleep disorder, unspecified: Secondary | ICD-10-CM | POA: Diagnosis not present

## 2022-03-27 NOTE — Procedures (Signed)
Patient Name: Jamie Holland, Jamie Holland Date: 03/21/2022 Gender: Male D.O.B: [birth year] Age (years): 57 Referring Provider: Geraldo Pitter NP Height (inches): 73 Interpreting Physician: Kara Mead MD, ABSM Weight (lbs): 250 RPSGT: Jacolyn Reedy BMI: 33 MRN: [id number] Neck Size: 17.00 <br> <br> CLINICAL INFORMATION Sleep Study Type: NPSG    Indication for sleep study: Fatigue, Obesity, Snoring    Epworth Sleepiness Score: 4    SLEEP STUDY TECHNIQUE As per the AASM Manual for the Scoring of Sleep and Associated Events v2.3 (April 2016) with a hypopnea requiring 4% desaturations.  The channels recorded and monitored were frontal, central and occipital EEG, electrooculogram (EOG), submentalis EMG (chin), nasal and oral airflow, thoracic and abdominal wall motion, anterior tibialis EMG, snore microphone, electrocardiogram, and pulse oximetry.  MEDICATIONS Medications self-administered by patient taken the night of the study : N/A  SLEEP ARCHITECTURE The study was initiated at 10:23:44 PM and ended at 5:15:21 AM.  Sleep onset time was 22.8 minutes and the sleep efficiency was 86.4%. The total sleep time was 355.5 minutes.  Stage REM latency was 90.5 minutes.  The patient spent 7.59% of the night in stage N1 sleep, 59.21% in stage N2 sleep, 15.05% in stage N3 and 18.1% in REM.  Alpha intrusion was absent.  Supine sleep was 0.00%.  RESPIRATORY PARAMETERS The overall apnea/hypopnea index (AHI) was 2.5 per hour. There were 0 total apneas, including 0 obstructive, 0 central and 0 mixed apneas. There were 15 hypopneas and 52 RERAs.  The AHI during Stage REM sleep was 13.0 per hour.  AHI while supine was N/A per hour.  The mean oxygen saturation was 94.88%. The minimum SpO2 during sleep was 91.00%.  moderate snoring was noted during this study.  CARDIAC DATA The 2 lead EKG demonstrated sinus rhythm. The mean heart rate was 75.69 beats per minute. Other EKG findings  include: None.   LEG MOVEMENT DATA The total PLMS were 0 with a resulting PLMS index of 0.00. Associated arousal with leg movement index was 0.0 .  IMPRESSIONS - No significant obstructive sleep apnea occurred during this study (AHI = 2.5/h). - The patient had minimal or no oxygen desaturation during the study (Min O2 = 91.00%) - The patient snored with moderate snoring volume. - No cardiac abnormalities were noted during this study. - Clinically significant periodic limb movements did not occur during sleep. No significant associated arousals.   DIAGNOSIS - No significant sleep disordered breathing   RECOMMENDATIONS - Avoid alcohol, sedatives and other CNS depressants that may worsen sleep apnea and disrupt normal sleep architecture. - Sleep hygiene should be reviewed to assess factors that may improve sleep quality. - Weight management and regular exercise should be initiated or continued if appropriate.   Kara Mead MD Board Certified in [place]

## 2022-03-28 NOTE — Progress Notes (Signed)
Please let patient know sleep study showed no evidence of sleep apnea or nocturnal desaturations. He has some events during REM sleep. Overall AHI 2.5/hr. Recommend weight loss, side sleeping position or elevate HOB 30 degrees and avoid alcohol or sedating medication prior to bedtime. Follow-up if needed

## 2022-10-16 ENCOUNTER — Ambulatory Visit: Payer: Commercial Managed Care - PPO | Attending: Cardiology | Admitting: Cardiology

## 2022-10-16 ENCOUNTER — Encounter: Payer: Self-pay | Admitting: Cardiology

## 2022-10-16 VITALS — BP 112/74 | HR 77 | Ht 73.0 in | Wt 262.8 lb

## 2022-10-16 DIAGNOSIS — E782 Mixed hyperlipidemia: Secondary | ICD-10-CM | POA: Diagnosis not present

## 2022-10-16 DIAGNOSIS — R7309 Other abnormal glucose: Secondary | ICD-10-CM

## 2022-10-16 NOTE — Progress Notes (Signed)
Cardiology Office Note:    Date:  10/16/2022   ID:  Jamie Holland, DOB 11-Dec-1970, MRN [id number]  PCP:  Patient, No Pcp Per   [facility]  Cardiologist:  Donato Schultz, MD  Advanced Practice Provider:  No care team member to display Electrophysiologist:  None      Referring MD: No ref. provider found    History of Present Illness:    Jamie Holland is a 52 y.o. male here for the follow-up of hyperlipidemia, and family history of CAD.  Previously here for follow-up of chest discomfort.  Has had fatigue in the past concerning for cardiac condition.  He smoked several years ago quit in 2010.  He is Pharmacist, community at [employer].  Father had a myocardial infarction at 37, grandfather died of MI after splitting wood.  On 11/07/20 he had a coronary calcium score of 0.  For activity he continues to work as the Pharmacist, community at [employer].  Lipoma.  Denies any fevers chills nausea vomiting chest pain.  Does have some lipid plaques in the retina.  His wife now sees Dr. Shari Prows.   Past Medical History:  Diagnosis Date   Arthritis    ? in thumbs   Combined hyperlipidemia    Erectile dysfunction    UNSPECIFIED   Fatigue    Hypercholesterolemia    Hyperglycemia    Low back pain    Low testosterone    Lump of skin of back    Maxillary sinusitis    Obesity    UNSPECIFIED   Perennial allergic rhinitis    PONV (postoperative nausea and vomiting)     Past Surgical History:  Procedure Laterality Date   KNEE ARTHROSCOPY Left    x2   NASAL SINUS SURGERY Left 07/03/2015   Procedure: LEFT MAXILLLARY SINUS ENDOSCOPIC SINUS SURGERY;  Surgeon: Serena Colonel, MD;  Location: [facility];  Service: ENT;  Laterality: Left;    Current Medications: Current Meds  Medication Sig   Apoaequorin (PREVAGEN PO) Take by mouth. 1 capsule daily   cholecalciferol (VITAMIN D3) 25 MCG (1000 UNIT) tablet Take 1,000 Units by  mouth. Every other day   Cyanocobalamin (VITAMIN B 12 PO) daily   Multiple Vitamin (MULTIVITAMIN ADULT) TABS 1 tablet   Multiple Vitamins-Minerals (HAIR SKIN AND NAILS FORMULA PO) Take by mouth daily.   vitamin E 180 MG (400 UNITS) capsule Take 400 Units by mouth. Every other day   Current Facility-Administered Medications for the 10/16/22 encounter (Office Visit) with Jake Bathe, MD  Medication   0.9 %  sodium chloride infusion     Allergies:   Patient has no known allergies.   Social History   Socioeconomic History   Marital status: Married    Spouse name: Not on file   Number of children: Not on file   Years of education: Not on file   Highest education level: Not on file  Occupational History   Not on file  Tobacco Use   Smoking status: Former    Types: Cigarettes    Quit date: 06/23/2008    Years since quitting: 14.3   Smokeless tobacco: Former  Building services engineer Use: Never used  Substance and Sexual Activity   Alcohol use: Yes    Comment: social   Drug use: No   Sexual activity: Not on file  Other Topics Concern   Not on file  Social History Narrative   Not  on file   Social Determinants of Health   Financial Resource Strain: Not on file  Food Insecurity: Not on file  Transportation Needs: Not on file  Physical Activity: Not on file  Stress: Not on file  Social Connections: Not on file     Family History: The patient's family history includes Allergies in his mother; Breast cancer in his mother; Cancer in his mother; Heart disease in his father; Hypertension in his father; Migraines in his mother. There is no history of Colon cancer, Colon polyps, Esophageal cancer, Rectal cancer, or Stomach cancer.  ROS:   Please see the history of present illness. All other systems are reviewed and negative.    EKGs/Labs/Other Studies Reviewed:    Coronary Calcium Score 11/07/2020: FINDINGS: Coronary arteries: Normal origins.   Coronary Calcium Score:    Left main: 0   Left anterior descending artery: 0   Left circumflex artery: 0   Right coronary artery: 0   Total: 0   Percentile: 0   Pericardium: Normal.   Ascending Aorta: Normal caliber.   Non-cardiac: See separate report from [facility].   IMPRESSION: Coronary calcium score of 0. This was 0 percentile for age-, race-, and sex-matched controls.  Exercise Tolerance Test 07/04/2020: Blood pressure demonstrated a normal response to exercise. There was no ST segment deviation noted during stress.   Negative adequate stress test.   EKG:  EKG is personally reviewed. 10/23/2021: Sinus rhythm. Rate 89 bpm. Nonspecific ST/T wave changes. 09/28/2020: sinus rhythm 76 no other abnormalities.  Recent Labs: No results found for requested labs within last 365 days.   Recent Lipid Panel No results found for: "CHOL", "TRIG", "HDL", "CHOLHDL", "VLDL", "LDLCALC", "LDLDIRECT"   Risk Assessment/Calculations:      Physical Exam:    VS:  BP 112/74   Pulse 77   Ht  (1.854 m)   Wt 262 lb 12.8 oz (119.2 kg)   SpO2 97%   BMI 34.67 kg/m     Wt Readings from Last 3 Encounters:  10/16/22 262 lb 12.8 oz (119.2 kg)  03/21/22 250 lb (113.4 kg)  02/14/22 261 lb 3.2 oz (118.5 kg)     GEN: Well nourished, well developed, in no acute distress HEENT: normal Neck: no JVD, carotid bruits, or masses Cardiac: RRR; no murmurs, rubs, or gallops,no edema  Respiratory:  clear to auscultation bilaterally, normal work of breathing GI: soft, nontender, nondistended, + BS MS: no deformity or atrophy Skin: warm and dry, no rash Neuro:  Alert and Oriented x 3, Strength and sensation are intact Psych: euthymic mood, full affect   ASSESSMENT:    1. Mixed hyperlipidemia   2. Elevated glucose level      PLAN:     Family history of ischemic heart disease (IHD) Father and grandfather had coronary artery disease.  Thankfully coronary calcium score was 0 in 2022. Will repeat in  2-3 years.  Mixed hyperlipidemia LDL 122, triglycerides 253.  Continue with diet, exercise.  Decrease carbohydrates.  Calcium score was 0. Will check Lp(a) - if elevated strongly consider Crestor for prevention.   Weight gain Continue to encourage weight loss. Enjoys egg white and spicy chicken from [employer].   Former smoker Quit back in 2010.  Excellent.   Follow-up:  1 year.  Medication Adjustments/Labs and Tests Ordered: Current medicines are reviewed at length with the patient today.  Concerns regarding medicines are outlined above.   Orders Placed This Encounter  Procedures   Hemoglobin A1c  Lipoprotein A (LPA)   Hemoglobin A1c   EKG 12-Lead   No orders of the defined types were placed in this encounter.  Patient Instructions  Medication Instructions:  Your physician recommends that you continue on your current medications as directed. Please refer to the Current Medication list given to you today.  *If you need a refill on your cardiac medications before your next appointment, please call your pharmacy*   Lab Work: [facility] If you have labs (blood work) drawn today and your tests are completely normal, you will receive your results only by: MyChart Message (if you have MyChart) OR A paper copy in the mail If you have any lab test that is abnormal or we need to change your treatment, we will call you to review the results.    Follow-Up: At [facility], you and your health needs are our priority.  As part of our continuing mission to provide you with exceptional heart care, we have created designated Provider Care Teams.  These Care Teams include your primary Cardiologist (physician) and Advanced Practice Providers (APPs -  Physician Assistants and Nurse Practitioners) who all work together to provide you with the care you need, when you need it.   Your next appointment:   1 year(s)  Provider:   Donato Schultz, MD       [facility],acting as a  scribe for Donato Schultz, MD.,have documented all relevant documentation on the behalf of Donato Schultz, MD,as directed by  Donato Schultz, MD while in the presence of Donato Schultz, MD.  I, Donato Schultz, MD, have reviewed all documentation for this visit. The documentation on 10/16/22 for the exam, diagnosis, procedures, and orders are all accurate and complete.   Signed, Donato Schultz, MD  10/16/2022 5:24 PM    [facility]

## 2022-10-16 NOTE — Patient Instructions (Signed)
Medication Instructions:  Your physician recommends that you continue on your current medications as directed. Please refer to the Current Medication list given to you today.  *If you need a refill on your cardiac medications before your next appointment, please call your pharmacy*   Lab Work: [facility] If you have labs (blood work) drawn today and your tests are completely normal, you will receive your results only by: MyChart Message (if you have MyChart) OR A paper copy in the mail If you have any lab test that is abnormal or we need to change your treatment, we will call you to review the results.    Follow-Up: At [facility], you and your health needs are our priority.  As part of our continuing mission to provide you with exceptional heart care, we have created designated Provider Care Teams.  These Care Teams include your primary Cardiologist (physician) and Advanced Practice Providers (APPs -  Physician Assistants and Nurse Practitioners) who all work together to provide you with the care you need, when you need it.   Your next appointment:   1 year(s)  Provider:   Donato Schultz, MD

## 2022-10-17 LAB — HEMOGLOBIN A1C: Est. average glucose Bld gHb Est-mCnc: 111 mg/dL

## 2022-10-21 LAB — LIPOPROTEIN A (LPA): Lipoprotein (a): 28.3 nmol/L (ref <75.0)

## 2022-10-21 LAB — HEMOGLOBIN A1C: Hgb A1c MFr Bld: 5.5 % (ref 4.8–5.6)

## 2023-10-16 ENCOUNTER — Encounter: Payer: Self-pay | Admitting: Cardiology

## 2023-10-16 ENCOUNTER — Ambulatory Visit: Payer: Commercial Managed Care - PPO | Attending: Cardiology | Admitting: Cardiology

## 2023-10-16 VITALS — BP 118/62 | HR 73 | Ht 73.0 in | Wt 258.0 lb

## 2023-10-16 DIAGNOSIS — Z8249 Family history of ischemic heart disease and other diseases of the circulatory system: Secondary | ICD-10-CM | POA: Diagnosis not present

## 2023-10-16 DIAGNOSIS — E782 Mixed hyperlipidemia: Secondary | ICD-10-CM | POA: Diagnosis not present

## 2023-10-16 DIAGNOSIS — R0602 Shortness of breath: Secondary | ICD-10-CM

## 2023-10-16 NOTE — Progress Notes (Signed)
  Cardiology Office Note:  .   Date:  10/16/2023  ID:  Jamie Holland, DOB 11/12/1970, MRN [id number] PCP: Alejandro Hurt, FNP  [facility] Providers Cardiologist:  Dorothye Gathers, MD     History of Present Illness: .   Jamie Holland is a 53 y.o. male Discussed the use of AI scribe software for clinical note transcription with the patient, who gave verbal consent to proceed.  History of Present Illness Jamie Holland is a 53 year old male who presents for follow-up regarding hyperlipidemia and family history of coronary artery disease.  He has a history of hyperlipidemia with an LDL cholesterol of 126 mg/dL last year. A coronary calcium score from 11/14/2020 was zero, indicating no calcified plaque, and his LP(a) was in the normal range at 28 in 11-15-22. He has never been on medications like statins or aspirin. His A1c was 5.5, and creatinine was 0.8, both within normal limits.  He occasionally experiences shortness of breath, which he attributes to stress, particularly during family-related stressors. This has been a long-standing issue and not a new symptom. No new symptoms were reported.  He quit smoking in 14-Nov-2008 and has maintained a weight between 245 and 260 pounds for years, recently weighing 250 pounds. He works as a Pharmacist, community, Programmer, systems.  His father had a myocardial infarction at age 54 and died in 11-14-20 after splitting wood.     Studies Reviewed: Aaron Aas   EKG Interpretation Date/Time:  Thursday October 16 2023 08:04:23 EDT Ventricular Rate:  73 PR Interval:  160 QRS Duration:  84 QT Interval:  392 QTC Calculation: 431 R Axis:   86  Text Interpretation: Normal sinus rhythm Normal ECG No previous ECGs available Confirmed by Dorothye Gathers ([id number]) on 10/16/2023 8:13:07 AM    Results LABS LP(a): 28 11-15-22) LDL cholesterol: 126 11/15/2022) A1c: 5.5 11-15-22) Creatinine: 0.8 2022-11-15)  RADIOLOGY Coronary calcium score: 0 Nov 14, 2020) Risk Assessment/Calculations:             Physical Exam:   VS:  BP 118/62   Pulse 73   Ht 6\' 1"  (1.854 m)   Wt 258 lb (117 kg)   SpO2 96%   BMI 34.04 kg/m    Wt Readings from Last 3 Encounters:  10/16/23 258 lb (117 kg)  10/16/22 262 lb 12.8 oz (119.2 kg)  03/21/22 250 lb (113.4 kg)    GEN: Well nourished, well developed in no acute distress NECK: No JVD; No carotid bruits CARDIAC: RRR, no murmurs, no rubs, no gallops RESPIRATORY:  Clear to auscultation without rales, wheezing or rhonchi  ABDOMEN: Soft, non-tender, non-distended EXTREMITIES:  No edema; No deformity   ASSESSMENT AND PLAN: .    Assessment and Plan Assessment & Plan Mixed hyperlipidemia Mixed hyperlipidemia is well-managed with LDL cholesterol at 126 mg/dL last year. Diet/exercise. Coronary calcium score was zero, indicating no calcified plaque. LP(a) was normal at 28 in Nov 15, 2022. No symptoms suggestive of coronary artery disease. Advised against testosterone therapy due to increased cardiovascular risks and family history of heart disease, despite potential energy benefits. - Continue current management without statins or aspirin. - Monitor for new symptoms such as chest tightness or changes in dyspnea. - Consider further imaging with contrast if symptoms suggestive of coronary artery disease develop.         Dispo: 1 yr  Signed, Dorothye Gathers, MD

## 2023-10-16 NOTE — Patient Instructions (Signed)
